# Patient Record
Sex: Female | Born: 1935 | Race: White | Smoking: Never smoker
Health system: Southern US, Community
[De-identification: ages and names within clinical notes are randomized; demographics above are authoritative.]

## PROBLEM LIST (undated history)

## (undated) DIAGNOSIS — E119 Type 2 diabetes mellitus without complications: Secondary | ICD-10-CM

## (undated) DIAGNOSIS — I10 Essential (primary) hypertension: Secondary | ICD-10-CM

## (undated) DIAGNOSIS — C801 Malignant (primary) neoplasm, unspecified: Secondary | ICD-10-CM

## (undated) DIAGNOSIS — H539 Unspecified visual disturbance: Secondary | ICD-10-CM

## (undated) HISTORY — PX: TONSILLECTOMY: SUR1361

## (undated) HISTORY — DX: Type 2 diabetes mellitus without complications: E11.9

## (undated) HISTORY — DX: Essential (primary) hypertension: I10

## (undated) HISTORY — PX: BREAST LUMPECTOMY: SHX2

## (undated) HISTORY — DX: Unspecified visual disturbance: H53.9

## (undated) HISTORY — PX: VESICOVAGINAL FISTULA CLOSURE W/ TAH: SUR271

---

## 2015-02-28 ENCOUNTER — Ambulatory Visit (INDEPENDENT_AMBULATORY_CARE_PROVIDER_SITE_OTHER): Payer: Medicare Other | Admitting: Neurology

## 2015-02-28 ENCOUNTER — Encounter: Payer: Self-pay | Admitting: Neurology

## 2015-02-28 VITALS — BP 140/88 | HR 64 | Resp 16 | Ht 64.0 in | Wt 166.6 lb

## 2015-02-28 DIAGNOSIS — M7062 Trochanteric bursitis, left hip: Secondary | ICD-10-CM | POA: Insufficient documentation

## 2015-02-28 DIAGNOSIS — R7989 Other specified abnormal findings of blood chemistry: Secondary | ICD-10-CM | POA: Diagnosis not present

## 2015-02-28 DIAGNOSIS — R269 Unspecified abnormalities of gait and mobility: Secondary | ICD-10-CM | POA: Insufficient documentation

## 2015-02-28 DIAGNOSIS — R0683 Snoring: Secondary | ICD-10-CM | POA: Diagnosis not present

## 2015-02-28 DIAGNOSIS — M7060 Trochanteric bursitis, unspecified hip: Secondary | ICD-10-CM | POA: Insufficient documentation

## 2015-02-28 DIAGNOSIS — R413 Other amnesia: Secondary | ICD-10-CM

## 2015-02-28 DIAGNOSIS — F329 Major depressive disorder, single episode, unspecified: Secondary | ICD-10-CM | POA: Diagnosis not present

## 2015-02-28 DIAGNOSIS — E538 Deficiency of other specified B group vitamins: Secondary | ICD-10-CM

## 2015-02-28 DIAGNOSIS — R4589 Other symptoms and signs involving emotional state: Secondary | ICD-10-CM

## 2015-02-28 MED ORDER — DONEPEZIL HCL 5 MG PO TABS: 5.0000 mg | ORAL_TABLET | Freq: Every day | ORAL | Status: DC
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
## 2015-03-01 ENCOUNTER — Telehealth: Payer: Self-pay | Admitting: *Deleted

## 2015-03-01 LAB — SEDIMENTATION RATE: SED RATE: 17 mm/h (ref 0–40)

## 2015-03-01 LAB — T4: T4, Total: 6 ug/dL (ref 4.5–12.0)

## 2015-03-01 LAB — TSH: TSH: 1.65 u[IU]/mL (ref 0.450–4.500)

## 2015-03-01 LAB — VITAMIN B12: Vitamin B-12: 526 pg/mL (ref 211–946)

## 2015-03-01 NOTE — Telephone Encounter (Signed)
Called and spoke to [place] (daughter-in-law) ok per DPR. Advised per Dr Felecia Shelling that lab work ok. She verbalized understanding. She stated that when her mother picked up aricept rx, the pharmacist told her the doctor should have not given her the medication. She told her about it 5 hr later. She did not know the reason because she was not with her when she got the rx from the pharmacist. She is going to call the pharmacy to find out. She said her mother-in-law gets confused easily. I advised I will give Dr Felecia Shelling the message also. Told her to call if she finds anything out. She verbalized understanding.

## 2015-03-01 NOTE — Telephone Encounter (Signed)
I have spoken with Andrea Burgess has spoken with the pharmacist--what he actually told pt. was to be sure to take Aricept at bedtime, with lots of water, to help minimize side effects./fim

## 2015-03-01 NOTE — Telephone Encounter (Signed)
-----   Message from Britt Bottom, MD sent at 03/01/2015  8:47 AM EST ----- Please let them know that the lab work looks good.

## 2015-03-12 ENCOUNTER — Encounter (HOSPITAL_BASED_OUTPATIENT_CLINIC_OR_DEPARTMENT_OTHER): Payer: Self-pay | Admitting: Emergency Medicine

## 2015-03-12 ENCOUNTER — Emergency Department (HOSPITAL_BASED_OUTPATIENT_CLINIC_OR_DEPARTMENT_OTHER): Payer: Medicare Other

## 2015-03-12 ENCOUNTER — Emergency Department (HOSPITAL_BASED_OUTPATIENT_CLINIC_OR_DEPARTMENT_OTHER)
Admission: EM | Admit: 2015-03-12 | Discharge: 2015-03-12 | Disposition: A | Discharge status: Home / Self Care | Payer: Medicare Other | Attending: Emergency Medicine | Admitting: Emergency Medicine

## 2015-03-12 DIAGNOSIS — J189 Pneumonia, unspecified organism: Secondary | ICD-10-CM

## 2015-03-12 DIAGNOSIS — I10 Essential (primary) hypertension: Secondary | ICD-10-CM | POA: Diagnosis not present

## 2015-03-12 DIAGNOSIS — R51 Headache: Secondary | ICD-10-CM | POA: Diagnosis not present

## 2015-03-12 DIAGNOSIS — R531 Weakness: Secondary | ICD-10-CM | POA: Diagnosis not present

## 2015-03-12 DIAGNOSIS — J159 Unspecified bacterial pneumonia: Secondary | ICD-10-CM | POA: Diagnosis not present

## 2015-03-12 DIAGNOSIS — Z79899 Other long term (current) drug therapy: Secondary | ICD-10-CM | POA: Diagnosis not present

## 2015-03-12 DIAGNOSIS — R05 Cough: Secondary | ICD-10-CM | POA: Diagnosis present

## 2015-03-12 DIAGNOSIS — Z8669 Personal history of other diseases of the nervous system and sense organs: Secondary | ICD-10-CM | POA: Diagnosis not present

## 2015-03-12 DIAGNOSIS — E119 Type 2 diabetes mellitus without complications: Secondary | ICD-10-CM | POA: Diagnosis not present

## 2015-03-12 LAB — COMPREHENSIVE METABOLIC PANEL
ALK PHOS: 91 U/L (ref 38–126)
ALT: 26 U/L (ref 14–54)
AST: 32 U/L (ref 15–41)
Albumin: 3 g/dL — ABNORMAL LOW (ref 3.5–5.0)
Anion gap: 7 (ref 5–15)
BILIRUBIN TOTAL: 0.7 mg/dL (ref 0.3–1.2)
BUN: 16 mg/dL (ref 6–20)
CALCIUM: 8.5 mg/dL — AB (ref 8.9–10.3)
CO2: 28 mmol/L (ref 22–32)
CREATININE: 0.64 mg/dL (ref 0.44–1.00)
Chloride: 96 mmol/L — ABNORMAL LOW (ref 101–111)
GFR calc non Af Amer: >60 mL/min (ref >60)
Glucose, Bld: 112 mg/dL — ABNORMAL HIGH (ref 65–99)
Potassium: 3.2 mmol/L — ABNORMAL LOW (ref 3.5–5.1)
SODIUM: 131 mmol/L — AB (ref 135–145)
TOTAL PROTEIN: 7.5 g/dL (ref 6.5–8.1)

## 2015-03-12 LAB — CBC WITH DIFFERENTIAL/PLATELET
BASOS PCT: 0 %
Basophils Absolute: 0 10*3/uL (ref 0.0–0.1)
EOS ABS: 0 10*3/uL (ref 0.0–0.7)
Eosinophils Relative: 0 %
HCT: 32.9 % — ABNORMAL LOW (ref 36.0–46.0)
HEMOGLOBIN: 11.2 g/dL — AB (ref 12.0–15.0)
Lymphocytes Relative: 14 %
Lymphs Abs: 1.3 10*3/uL (ref 0.7–4.0)
MCH: 34 pg (ref 26.0–34.0)
MCHC: 34 g/dL (ref 30.0–36.0)
MCV: 100 fL (ref 78.0–100.0)
MONO ABS: 0.3 10*3/uL (ref 0.1–1.0)
MONOS PCT: 3 %
NEUTROS PCT: 83 %
Neutro Abs: 7.8 10*3/uL — ABNORMAL HIGH (ref 1.7–7.7)
Platelets: 239 10*3/uL (ref 150–400)
RBC: 3.29 MIL/uL — ABNORMAL LOW (ref 3.87–5.11)
RDW: 12.4 % (ref 11.5–15.5)
WBC: 9.4 10*3/uL (ref 4.0–10.5)

## 2015-03-12 MED ORDER — SODIUM CHLORIDE 0.9 % IV BOLUS (SEPSIS)
500.0000 mL | Freq: Once | INTRAVENOUS | Status: AC
  Administered 2015-03-12: 500 mL via INTRAVENOUS

## 2015-03-12 MED ORDER — LEVOFLOXACIN 500 MG PO TABS
500.0000 mg | ORAL_TABLET | Freq: Once | ORAL | Status: AC
  Administered 2015-03-12: 500 mg via ORAL
  Filled 2015-03-12: qty 1

## 2015-03-12 MED ORDER — POTASSIUM CHLORIDE CRYS ER 20 MEQ PO TBCR
20.0000 meq | EXTENDED_RELEASE_TABLET | Freq: Once | ORAL | Status: AC
  Administered 2015-03-12: 20 meq via ORAL
  Filled 2015-03-12: qty 1

## 2015-03-12 MED ORDER — LEVOFLOXACIN 500 MG PO TABS: 500.0000 mg | ORAL_TABLET | Freq: Every day | ORAL | Status: DC

## 2015-03-12 NOTE — ED Notes (Addendum)
Pt's family reports pt passed out and fell at 28 on Friday morning in her room- states they heard her hit the floor- pt was waking up as they arrived in her room- pt reported she didn't know how she had gotten out of bed- since then she has complained of increased weakness and not ambulating per her norm

## 2015-03-12 NOTE — Discharge Instructions (Signed)
Your chest xray shows pneumonia.  Your lab work shows that you are mildly dehydrated with a slightly low sodium and potassium. It is important to drink plenty of fluids and rest. Please get rechecked immediately if you have any worsening in your breathing, confusion, new or worrisome symptoms. Please follow-up with your primary care doctor to get a recheck and have her sodium rechecked.   Community-Acquired Pneumonia, Adult Pneumonia is an infection of the lungs. There are different types of pneumonia. One type can develop while a person is in a hospital. A different type, called community-acquired pneumonia, develops in people who are not, or have not recently been, in the hospital or other health care facility.  CAUSES Pneumonia may be caused by bacteria, viruses, or funguses. Community-acquired pneumonia is often caused by Streptococcus pneumonia bacteria. These bacteria are often passed from one person to another by breathing in droplets from the cough or sneeze of an infected person. RISK FACTORS The condition is more likely to develop in:  People who havechronic diseases, such as chronic obstructive pulmonary disease (COPD), asthma, congestive heart failure, cystic fibrosis, diabetes, or kidney disease.  People who haveearly-stage or late-stage HIV.  People who havesickle cell disease.  People who havehad their spleen removed (splenectomy).  People who havepoor Human resources officer.  People who havemedical conditions that increase the risk of breathing in (aspirating) secretions their own mouth and nose.   People who havea weakened immune system (immunocompromised).  People who smoke.  People whotravel to areas where pneumonia-causing germs commonly exist.  People whoare around animal habitats or animals that have pneumonia-causing germs, including birds, bats, rabbits, cats, and farm animals. SYMPTOMS Symptoms of this condition include:  Adry cough.  A wet (productive)  cough.  Fever.  Sweating.  Chest pain, especially when breathing deeply or coughing.  Rapid breathing or difficulty breathing.  Shortness of breath.  Shaking chills.  Fatigue.  Muscle aches. DIAGNOSIS Your health care provider will take a medical history and perform a physical exam. You may also have other tests, including:  Imaging studies of your chest, including X-rays.  Tests to check your blood oxygen level and other blood gases.  Other tests on blood, mucus (sputum), fluid around your lungs (pleural fluid), and urine. If your pneumonia is severe, other tests may be done to identify the specific cause of your illness. TREATMENT The type of treatment that you receive depends on many factors, such as the cause of your pneumonia, the medicines you take, and other medical conditions that you have. For most adults, treatment and recovery from pneumonia may occur at home. In some cases, treatment must happen in a hospital. Treatment may include:  Antibiotic medicines, if the pneumonia was caused by bacteria.  Antiviral medicines, if the pneumonia was caused by a virus.  Medicines that are given by mouth or through an IV tube.  Oxygen.  Respiratory therapy. Although rare, treating severe pneumonia may include:  Mechanical ventilation. This is done if you are not breathing well on your own and you cannot maintain a safe blood oxygen level.  Thoracentesis. This procedureremoves fluid around one lung or both lungs to help you breathe better. HOME CARE INSTRUCTIONS  Take over-the-counter and prescription medicines only as told by your health care provider.  Only takecough medicine if you are losing sleep. Understand that cough medicine can prevent your body's natural ability to remove mucus from your lungs.  If you were prescribed an antibiotic medicine, take it as told by your health  care provider. Do not stop taking the antibiotic even if you start to feel  better.  Sleep in a semi-upright position at night. Try sleeping in a reclining chair, or place a few pillows under your head.  Do not use tobacco products, including cigarettes, chewing tobacco, and e-cigarettes. If you need help quitting, ask your health care provider.  Drink enough water to keep your urine clear or pale yellow. This will help to thin out mucus secretions in your lungs. PREVENTION There are ways that you can decrease your risk of developing community-acquired pneumonia. Consider getting a pneumococcal vaccine if:  You are older than 79 years of age.  You are older than 79 years of age and are undergoing cancer treatment, have chronic lung disease, or have other medical conditions that affect your immune system. Ask your health care provider if this applies to you. There are different types and schedules of pneumococcal vaccines. Ask your health care provider which vaccination option is best for you. You may also prevent community-acquired pneumonia if you take these actions:  Get an influenza vaccine every year. Ask your health care provider which type of influenza vaccine is best for you.  Go to the dentist on a regular basis.  Wash your hands often. Use hand sanitizer if soap and water are not available. SEEK MEDICAL CARE IF:  You have a fever.  You are losing sleep because you cannot control your cough with cough medicine. SEEK IMMEDIATE MEDICAL CARE IF:  You have worsening shortness of breath.  You have increased chest pain.  Your sickness becomes worse, especially if you are an older adult or have a weakened immune system.  You cough up blood.   This information is not intended to replace advice given to you by your health care provider. Make sure you discuss any questions you have with your health care provider.   Document Released: 03/04/2005 Document Revised: 11/23/2014 Document Reviewed: 06/29/2014 Elsevier Interactive Patient Education [employer]

## 2015-03-12 NOTE — ED Notes (Signed)
MD at bedside. 

## 2015-03-12 NOTE — ED Provider Notes (Signed)
CSN: [id number]     Arrival date & time 03/12/15  1432 History  By signing my name below, I, Randa Evens, attest that this documentation has been prepared under the direction and in the presence of No att. providers found. Electronically Signed: Randa Evens, ED Scribe. 03/13/2015. 12:51 AM.      Chief Complaint  Patient presents with  . Cough    Patient is a 81 y.o. female presenting with cough. The history is provided by the patient and a relative. No language interpreter was used.  Cough Associated symptoms: headaches   Associated symptoms: no chest pain (brought on from coughing) and no fever    HPI Comments: Constantina Cornatzer is a 81 y.o. female who presents to the Emergency Department complaining of productive cough onset 5 days prior. Pt reports associated congestion and HA.  Pt states that she initially thought she was getting and URI. Family states that she had worsening weakness for the 3 days. Reports fall 2 days prior with no injuries. PTdenies fever, abdominal pain n/v/d. Denies HX of seizure. Pt denies any recent hospital admissions. Family member states that she has recently been diagnosed with early stages if Dementia. Symptoms are moderate, constant, worsening.     Past Medical History  Diagnosis Date  . Diabetes mellitus without complication ([place])   . Hypertension   . Vision abnormalities    Past Surgical History  Procedure Laterality Date  . Tonsillectomy    . Vesicovaginal fistula closure w/ tah    . Breast lumpectomy Left    Family History  Problem Relation Age of Onset  . Healthy Mother    Social History  Substance Use Topics  . Smoking status: Never Smoker   . Smokeless tobacco: None  . Alcohol Use: No   OB History    No data available      Review of Systems  Constitutional: Negative for fever.  HENT: Positive for congestion.   Respiratory: Positive for cough.   Cardiovascular: Negative for chest pain (brought on from coughing).   Gastrointestinal: Negative for nausea, vomiting, abdominal pain and diarrhea.  Neurological: Positive for weakness and headaches.  All other systems reviewed and are negative.    Allergies  Review of patient's allergies indicates no known allergies.  Home Medications   Prior to Admission medications   Medication Sig Start Date End Date Taking? Authorizing Provider  donepezil (ARICEPT) 5 MG tablet Take 1 tablet (5 mg total) by mouth at bedtime. 02/28/15   Britt Bottom, MD  gabapentin (NEURONTIN) 300 MG capsule Take 300 mg by mouth 4 (four) times daily.    Historical Provider, MD  levofloxacin (LEVAQUIN) 500 MG tablet Take 1 tablet (500 mg total) by mouth daily. 03/12/15   Quintella Reichert, MD  metoprolol succinate (TOPROL-XL) 100 MG 24 hr tablet Take 100 mg by mouth daily. Take with or immediately following a meal.    Historical Provider, MD  omeprazole (PRILOSEC) 20 MG capsule Take 20 mg by mouth daily.    Historical Provider, MD  pravastatin (PRAVACHOL) 40 MG tablet Take 40 mg by mouth daily.    Historical Provider, MD  terbinafine (LAMISIL) 250 MG tablet Take 250 mg by mouth daily.    Historical Provider, MD   BP 141/79 mmHg  Pulse 94  Temp(Src) 98.9 F (37.2 C) (Oral)  Resp 20  Ht 5\' 4"  (1.626 m)  Wt 168 lb (76.204 kg)  BMI 28.82 kg/m2  SpO2 95%   Physical Exam  Constitutional: She appears  well-developed and well-nourished. No distress.  HENT:  Head: Normocephalic and atraumatic.  Eyes: Conjunctivae and EOM are normal.  Neck: Neck supple. No tracheal deviation present.  Cardiovascular: Normal rate and regular rhythm.   No murmur heard. Pulmonary/Chest: Effort normal. No respiratory distress. She has rhonchi.  Rhonchi in bilateral bases   Abdominal: Soft. There is no tenderness. There is no rebound and no guarding.  Musculoskeletal: Normal range of motion. She exhibits no edema or tenderness.  Neurological: She is alert.  Mild generalized weakness.   Skin: Skin is  warm and dry.  Psychiatric: She has a normal mood and affect. Her behavior is normal.  Nursing note and vitals reviewed.   ED Course  Procedures (including critical care time) DIAGNOSTIC STUDIES: Oxygen Saturation is 95% on RA, normal by my interpretation.    COORDINATION OF CARE: 3:24 PM-Discussed treatment plan with pt at bedside and pt agreed to plan.     Labs Review Labs Reviewed  COMPREHENSIVE METABOLIC PANEL - Abnormal; Notable for the following:    Sodium 131 (*)    Potassium 3.2 (*)    Chloride 96 (*)    Glucose, Bld 112 (*)    Calcium 8.5 (*)    Albumin 3.0 (*)    All other components within normal limits  CBC WITH DIFFERENTIAL/PLATELET - Abnormal; Notable for the following:    RBC 3.29 (*)    Hemoglobin 11.2 (*)    HCT 32.9 (*)    Neutro Abs 7.8 (*)    All other components within normal limits    Imaging Review Dg Chest 2 View  03/12/2015  CLINICAL DATA:  Cough for 5 days. Syncope and weakness. Initial encounter. EXAM: CHEST  2 VIEW COMPARISON:  None. FINDINGS: There is right middle lobe airspace disease worrisome for pneumonia. The left lung is clear. Heart size is normal. No pneumothorax or pleural effusion. IMPRESSION: Right middle lobe airspace disease worrisome for pneumonia. Recommend followup to clearing. Electronically Signed   By: Inge Rise M.D.   On: 03/12/2015 14:52   I have personally reviewed and evaluated these images and lab results as part of my medical decision-making.   EKG Interpretation None      MDM   Final diagnoses:  CAP (community acquired pneumonia)   Patient here for evaluation of cough, generalized weakness. Chest x-ray and history is concerning for pneumonia. Patient is nontoxic appearing on examination with no evidence of respiratory distress or respiratory failure. Plan to DC home with antibiotics for community-acquired pneumonia. Discussed home care as well as very close return precautions for evidence of progressive or  worsening symptoms.  I personally performed the services described in this documentation, which was scribed in my presence. The recorded information has been reviewed and is accurate.      Quintella Reichert, MD 03/13/15 [phone]

## 2015-03-12 NOTE — ED Notes (Signed)
Patient transported to X-ray 

## 2015-03-12 NOTE — ED Notes (Signed)
Patient states that she has had a cold for the last 4 - 5 days  With congested cough. The patient reports that she passed out on Friday from the weakness and today is more and more weak and more and more congested.

## 2015-03-17 ENCOUNTER — Other Ambulatory Visit: Payer: Self-pay

## 2015-04-03 ENCOUNTER — Ambulatory Visit
Admission: RE | Admit: 2015-04-03 | Discharge: 2015-04-03 | Disposition: A | Discharge status: Home / Self Care | Payer: Medicare Other | Source: Ambulatory Visit | Attending: Neurology | Admitting: Neurology

## 2015-04-03 DIAGNOSIS — R413 Other amnesia: Secondary | ICD-10-CM

## 2015-04-03 DIAGNOSIS — R269 Unspecified abnormalities of gait and mobility: Secondary | ICD-10-CM

## 2015-04-04 ENCOUNTER — Telehealth: Payer: Self-pay | Admitting: *Deleted

## 2015-04-04 NOTE — Telephone Encounter (Signed)
LMTC./fim 

## 2015-04-04 NOTE — Telephone Encounter (Signed)
I have spoken with [name] this morning and per RAS, advised that mri brain shows a lot of atrophy and age related changes that over time will commonly cause dementia.  Nothing active.  She is taking Aricept.  RAS will discuss further at f/u appt.  Debbie verbalized understanding of same./fim

## 2015-04-04 NOTE — Telephone Encounter (Signed)
-----   Message from Britt Bottom, MD sent at 04/03/2015  8:29 PM EST ----- Please let her family know that the MRI of the brain shows a lot of atrophy and age-related changes in those factors usually develop over time and commonly cause dementia. Nothing that looks active

## 2015-04-15 DIAGNOSIS — E7800 Pure hypercholesterolemia, unspecified: Secondary | ICD-10-CM | POA: Insufficient documentation

## 2015-04-15 DIAGNOSIS — J309 Allergic rhinitis, unspecified: Secondary | ICD-10-CM | POA: Insufficient documentation

## 2015-04-15 DIAGNOSIS — E119 Type 2 diabetes mellitus without complications: Secondary | ICD-10-CM | POA: Insufficient documentation

## 2015-04-15 DIAGNOSIS — IMO0002 Reserved for concepts with insufficient information to code with codable children: Secondary | ICD-10-CM | POA: Insufficient documentation

## 2015-04-15 DIAGNOSIS — Q846 Other congenital malformations of nails: Secondary | ICD-10-CM | POA: Insufficient documentation

## 2015-04-15 DIAGNOSIS — D519 Vitamin B12 deficiency anemia, unspecified: Secondary | ICD-10-CM | POA: Insufficient documentation

## 2015-04-15 DIAGNOSIS — R413 Other amnesia: Secondary | ICD-10-CM | POA: Insufficient documentation

## 2015-04-15 DIAGNOSIS — F5102 Adjustment insomnia: Secondary | ICD-10-CM | POA: Insufficient documentation

## 2015-04-15 DIAGNOSIS — M26629 Arthralgia of temporomandibular joint, unspecified side: Secondary | ICD-10-CM | POA: Insufficient documentation

## 2015-04-15 DIAGNOSIS — I10 Essential (primary) hypertension: Secondary | ICD-10-CM | POA: Insufficient documentation

## 2015-05-08 DIAGNOSIS — M47817 Spondylosis without myelopathy or radiculopathy, lumbosacral region: Secondary | ICD-10-CM | POA: Insufficient documentation

## 2015-05-13 DIAGNOSIS — M545 Low back pain: Secondary | ICD-10-CM

## 2015-05-13 DIAGNOSIS — G8929 Other chronic pain: Secondary | ICD-10-CM | POA: Insufficient documentation

## 2015-05-13 DIAGNOSIS — K219 Gastro-esophageal reflux disease without esophagitis: Secondary | ICD-10-CM | POA: Insufficient documentation

## 2015-06-01 ENCOUNTER — Ambulatory Visit (INDEPENDENT_AMBULATORY_CARE_PROVIDER_SITE_OTHER): Payer: Medicare Other | Admitting: Neurology

## 2015-06-01 ENCOUNTER — Encounter: Payer: Self-pay | Admitting: Neurology

## 2015-06-01 VITALS — BP 148/96 | HR 68 | Resp 18 | Ht 64.0 in | Wt 159.4 lb

## 2015-06-01 DIAGNOSIS — F329 Major depressive disorder, single episode, unspecified: Secondary | ICD-10-CM | POA: Diagnosis not present

## 2015-06-01 DIAGNOSIS — R269 Unspecified abnormalities of gait and mobility: Secondary | ICD-10-CM | POA: Diagnosis not present

## 2015-06-01 DIAGNOSIS — F5102 Adjustment insomnia: Secondary | ICD-10-CM

## 2015-06-01 DIAGNOSIS — F0280 Dementia in other diseases classified elsewhere without behavioral disturbance: Secondary | ICD-10-CM | POA: Insufficient documentation

## 2015-06-01 DIAGNOSIS — R4589 Other symptoms and signs involving emotional state: Secondary | ICD-10-CM

## 2015-06-01 DIAGNOSIS — G301 Alzheimer's disease with late onset: Secondary | ICD-10-CM

## 2015-06-01 DIAGNOSIS — M5442 Lumbago with sciatica, left side: Secondary | ICD-10-CM

## 2015-06-01 DIAGNOSIS — M7552 Bursitis of left shoulder: Secondary | ICD-10-CM | POA: Diagnosis not present

## 2015-06-01 DIAGNOSIS — Z92241 Personal history of systemic steroid therapy: Secondary | ICD-10-CM | POA: Diagnosis not present

## 2015-06-01 DIAGNOSIS — M7550 Bursitis of unspecified shoulder: Secondary | ICD-10-CM | POA: Insufficient documentation

## 2015-06-01 DIAGNOSIS — M7062 Trochanteric bursitis, left hip: Secondary | ICD-10-CM

## 2015-06-01 DIAGNOSIS — G309 Alzheimer's disease, unspecified: Secondary | ICD-10-CM

## 2015-06-01 MED ORDER — MEMANTINE HCL 10 MG PO TABS: 10.0000 mg | ORAL_TABLET | Freq: Two times a day (BID) | ORAL | Status: DC

## 2015-06-01 MED ORDER — MEMANTINE HCL 28 X 5 MG & 21 X 10 MG PO TABS: ORAL_TABLET | ORAL | Status: DC
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
## 2015-06-07 ENCOUNTER — Telehealth: Payer: Self-pay | Admitting: Neurology

## 2015-06-07 NOTE — Telephone Encounter (Signed)
Andrea Burgess called to advise, [facility] needs PA for memantine [facility] TITRATION PAK) tablet pack, memantine (NAMENDA) 10 MG tablet.

## 2015-06-07 NOTE — Telephone Encounter (Signed)
Debbie also wanted to advise that when patient was here for recent visit, she kept insisting that her highest level of education was college. Debbie states, patient took a Scientist, research (life sciences) estate course, however she didn't attend college.

## 2015-06-08 ENCOUNTER — Telehealth: Payer: Self-pay | Admitting: Neurology

## 2015-06-08 NOTE — Telephone Encounter (Signed)
Pt's daughter [name] called sts the injection in lumbar helped for 3-4 days but it is hurting again. Daughter said she seems to be moving more freely but still having problems getting up and down. What is the next step.

## 2015-06-09 NOTE — Telephone Encounter (Signed)
I have spoken with [name] and advised that I have spoken with Alroy Dust at [facility] who sts. Namenda does not need a PA.  Ref # [id number].  I attempted to contact [facility] with this information but was placed on hold 3 times, finally had to end the call/fim

## 2015-06-09 NOTE — Telephone Encounter (Signed)
I have spoken with Andrea Burgess and advised that [facility] would like to get results of bone density exam that was ordered at last ov.  Quynn should continue Diclofenac that she is currently taking, and continue reasonable activity/exercise.  Debbie verbalized understanding of same/fim

## 2015-06-15 ENCOUNTER — Telehealth: Payer: Self-pay | Admitting: Neurology

## 2015-06-15 ENCOUNTER — Other Ambulatory Visit: Payer: Self-pay | Admitting: *Deleted

## 2015-06-15 DIAGNOSIS — Z7952 Long term (current) use of systemic steroids: Secondary | ICD-10-CM

## 2015-06-15 NOTE — Telephone Encounter (Signed)
Called and spoke to patient's daughter [name] [phone] . Patient is scheduled for Bone Density test at [address] 07/04/2015 at 11:15 am . Per [facility] she needs to hold calcium for two days. PCP has her on calcium she will call and check with Patient's PCP is ok with this order. Patient daughter under stood all details of her mother's apt.

## 2015-06-15 NOTE — Telephone Encounter (Signed)
Hey Faith per [facility] order needs to be change with this code Z79.52. Long steroid use  Or insurance want pay. DG Bone Density Scan. Patient's daughter want's me to call her when scheduled. Debbie [phone]. Please send back to me I will call [facility] and have patient scheduled. Thanks Hinton Dyer.

## 2015-07-03 ENCOUNTER — Telehealth: Payer: Self-pay | Admitting: Neurology

## 2015-07-03 NOTE — Telephone Encounter (Signed)
Form signed and faxed back to [facility] (for bone density scan)/fim

## 2015-07-03 NOTE — Telephone Encounter (Signed)
Felia with the [facility] is calling and states she receivied a referral for the patient and needs Dr. Felecia Shelling to either sign the order in EPIC or on the order form she faxed. Her phone number is [phone].

## 2015-07-04 ENCOUNTER — Ambulatory Visit
Admission: RE | Admit: 2015-07-04 | Discharge: 2015-07-04 | Disposition: A | Discharge status: Home / Self Care | Payer: Medicare Other | Source: Ambulatory Visit | Attending: Neurology | Admitting: Neurology

## 2015-07-04 DIAGNOSIS — Z7952 Long term (current) use of systemic steroids: Secondary | ICD-10-CM

## 2015-07-06 ENCOUNTER — Telehealth: Payer: Self-pay | Admitting: *Deleted

## 2015-07-06 NOTE — Telephone Encounter (Signed)
I have spoken with Debbie (dtr. in law) and per RAS, advised that bone density scan showed osteopenia--for now she should take otc calcium an vit. d 2,000iu daily.  She verbalized understanding of same/fim

## 2015-07-06 NOTE — Telephone Encounter (Signed)
-----   Message from Britt Bottom, MD sent at 07/05/2015  5:27 PM EDT ----- Please let them know that the bone density study does show osteopenia. For now, she should take over the counter calcium and 2000 units daily of vitamin D. We can also forward the results to her primary care physician as she may need further treatment if this worsens in the future

## 2015-07-11 ENCOUNTER — Telehealth: Payer: Self-pay | Admitting: Neurology

## 2015-07-11 NOTE — Telephone Encounter (Signed)
I have spoken with pharmacist at [facility], pa is not needed.  I have advised they request genric (rx. is not brand name med. nec,)/fim

## 2015-07-11 NOTE — Telephone Encounter (Signed)
Margaret/[facility] [phone] called to request PA for memantine [facility] TITRATION PAK) tablet pack, states this medication does require PA.

## 2015-09-12 DIAGNOSIS — R319 Hematuria, unspecified: Secondary | ICD-10-CM | POA: Insufficient documentation

## 2015-10-02 ENCOUNTER — Ambulatory Visit (INDEPENDENT_AMBULATORY_CARE_PROVIDER_SITE_OTHER): Payer: Medicare Other | Admitting: Neurology

## 2015-10-02 ENCOUNTER — Encounter: Payer: Self-pay | Admitting: Neurology

## 2015-10-02 VITALS — BP 114/72 | HR 70 | Resp 18 | Ht 64.0 in | Wt 156.0 lb

## 2015-10-02 DIAGNOSIS — F0280 Dementia in other diseases classified elsewhere without behavioral disturbance: Secondary | ICD-10-CM | POA: Diagnosis not present

## 2015-10-02 DIAGNOSIS — M7062 Trochanteric bursitis, left hip: Secondary | ICD-10-CM | POA: Diagnosis not present

## 2015-10-02 DIAGNOSIS — M7552 Bursitis of left shoulder: Secondary | ICD-10-CM

## 2015-10-02 DIAGNOSIS — R4589 Other symptoms and signs involving emotional state: Secondary | ICD-10-CM

## 2015-10-02 DIAGNOSIS — G301 Alzheimer's disease with late onset: Secondary | ICD-10-CM

## 2015-10-02 DIAGNOSIS — F329 Major depressive disorder, single episode, unspecified: Secondary | ICD-10-CM

## 2015-10-02 DIAGNOSIS — R269 Unspecified abnormalities of gait and mobility: Secondary | ICD-10-CM

## 2015-10-02 MED ORDER — DONEPEZIL HCL 10 MG PO TABS: 5.0000 mg | ORAL_TABLET | Freq: Every day | ORAL | Status: DC
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
## 2015-10-04 ENCOUNTER — Other Ambulatory Visit: Payer: Self-pay | Admitting: Pain Medicine

## 2015-10-04 DIAGNOSIS — M545 Low back pain: Secondary | ICD-10-CM

## 2015-10-13 ENCOUNTER — Ambulatory Visit
Admission: RE | Admit: 2015-10-13 | Discharge: 2015-10-13 | Disposition: A | Discharge status: Home / Self Care | Payer: Medicare Other | Source: Ambulatory Visit | Attending: Pain Medicine | Admitting: Pain Medicine

## 2015-10-13 DIAGNOSIS — M545 Low back pain: Secondary | ICD-10-CM

## 2015-11-13 ENCOUNTER — Telehealth: Payer: Self-pay | Admitting: Neurology

## 2015-11-13 MED ORDER — DONEPEZIL HCL 10 MG PO TABS: 10.0000 mg | ORAL_TABLET | Freq: Every day | ORAL | 11 refills | Status: AC

## 2015-11-13 NOTE — Telephone Encounter (Signed)
I have spoken with Debbie.  Per RAS last ov, she is correct in giving [place] Aricept 10mg  po daily.  New rx. escribed to pharmacy so instructions will be correct.  She verbalized understanding of same/fim

## 2015-11-13 NOTE — Telephone Encounter (Signed)
Pt's daughter-in-law called to advise when she picked up the refill for donepezil (ARICEPT) 10 MG tablet she noticed it was 10mg  and the directions were to give 1/2 tablet but she said has been giving the pt 1 tablet daily because she thought it was still 5mg . She said the pt is better with the 10mg , she is not talking about imaginary people. Please call to clarify.

## 2016-04-08 ENCOUNTER — Ambulatory Visit (INDEPENDENT_AMBULATORY_CARE_PROVIDER_SITE_OTHER): Payer: Medicare Other | Admitting: Neurology

## 2016-04-08 ENCOUNTER — Encounter: Payer: Self-pay | Admitting: Neurology

## 2016-04-08 VITALS — BP 138/90 | HR 68 | Resp 18 | Ht 64.0 in | Wt 150.5 lb

## 2016-04-08 DIAGNOSIS — M545 Low back pain, unspecified: Secondary | ICD-10-CM

## 2016-04-08 DIAGNOSIS — G8929 Other chronic pain: Secondary | ICD-10-CM | POA: Diagnosis not present

## 2016-04-08 DIAGNOSIS — G301 Alzheimer's disease with late onset: Secondary | ICD-10-CM

## 2016-04-08 DIAGNOSIS — F0280 Dementia in other diseases classified elsewhere without behavioral disturbance: Secondary | ICD-10-CM | POA: Diagnosis not present

## 2016-04-08 DIAGNOSIS — R269 Unspecified abnormalities of gait and mobility: Secondary | ICD-10-CM | POA: Diagnosis not present

## 2016-04-08 DIAGNOSIS — M7062 Trochanteric bursitis, left hip: Secondary | ICD-10-CM | POA: Diagnosis not present

## 2016-04-08 NOTE — Patient Instructions (Signed)
Memantine 10 mg pills:  For 5 days, take one half pill once a day For the next 5 days, take one half pill twice a day Then increase to one pill twice a day

## 2016-04-08 NOTE — Progress Notes (Signed)
[facility]  PATIENT: Andrea Burgess DOB: 08/02/1927  REFERRING DOCTOR OR PCP:  Ferne Coe, PA-C ([place]). SOURCE: patient, records form PCP  _________________________________   HISTORICAL  CHIEF COMPLAINT:  Chief Complaint  Patient presents with  . Alzheimer's Disease    Sts. she feels memory is stable.  She is not taking Namenda--sts. she never received it.  Sts. she is seeing Dr. Andree Elk at [facility] for ? rf procedure/fim  . Back Pain    HISTORY OF PRESENT ILLNESS:  Andrea Burgess is an 83 yo woman with memory loss, hip pain and back pain.    Alzheimer's Disease:      Aricept was added initially and she felt memory was slightly better.   She was unable to get Namenda.  She feels long term memory is better than STM.  She has trouble using cell phone and remote control.       She has difficulties with short-term memory and also has misplaced items around the house. She stopped driving in [id number].   She is not reading books as frequently in the past but still looks at magazines to read. She has had some difficulty with writing out checks.   MRI of the brain that showed very severe frontal lobe atrophy with more modest mesial temporal and parietal lobe atrophy. She also had extensive chronic microvascular ischemic changes.     She has no FH of Alzheimer or other dementia.      Montreal Cognitive Assessment  06/01/2015 02/28/2015  Visuospatial/ Executive (0/5) 2 2  Naming (0/3) 1 1  Attention: Read list of digits (0/2) 2 2  Attention: Read list of letters (0/1) 1 1  Attention: Serial 7 subtraction starting at 100 (0/3) 0 0  Language: Repeat phrase (0/2) 2 2  Language : Fluency (0/1) 0 0  Abstraction (0/2) 2 2  Delayed Recall (0/5) 0 0  Orientation (0/6) 4 5  Total 14 15  Adjusted Score (based on education) 14 16    Sleep:   She has insomnia, sleep onset and sleep maintenance.   She has trouble getting comfortable at night    Mood:   Mood is better this year.   No anxiety.   No behavoiral issues  Leg/Hip pain:   She reports chronic left hip, left leg pain pain.  She now sees Pain management and is having procedures done.      The hip and leg pain worsens when she stands a while or she walks. Her leg can give out if she walks far enough.   She is on gabapentin for pain.   A left trochanteric bursa injection x 2 Only helped a lot for a couple weeks..   Shoulder pain:    The left shoulder pain is better with just mild tenderness now    A subacromial bursa injection helped a few months    REVIEW OF SYSTEMS: Constitutional: No fevers, chills, sweats, or change in appetite.    Eyes: No visual changes, double vision, eye pain Ear, nose and throat: No hearing loss, ear pain, nasal congestion, sore throat Cardiovascular: No chest pain, palpitations Respiratory: No shortness of breath at rest or with exertion.   No wheezes.  Snores.   GastrointestinaI: No nausea, vomiting, diarrhea, abdominal pain, fecal incontinence Genitourinary: No dysuria, urinary retention or frequency.  No nocturia. Musculoskeletal: as above Integumentary: No rash, pruritus, skin lesions Neurological: as above Psychiatric: No depression at this time.  No anxiety Endocrine: No palpitations, diaphoresis,  change in appetite, change in weigh or increased thirst Hematologic/Lymphatic: No anemia, purpura, petechiae. Allergic/Immunologic: No itchy/runny eyes, nasal congestion, recent allergic reactions, rashes  ALLERGIES: No Known Allergies  HOME MEDICATIONS:  Current Outpatient Prescriptions:  .  acetaminophen (TYLENOL) 325 MG tablet, Take 650 mg by mouth every 6 (six) hours as needed., Disp: , Rfl:  .  Calcium-Vitamins C & D (CALCIUM/C/D) 500-10-250 MG-MG-UNIT CHEW, Chew by mouth., Disp: , Rfl:  .  diclofenac (VOLTAREN) 75 MG EC tablet, , Disp: , Rfl:  .  donepezil (ARICEPT) 10 MG tablet, Take 1 tablet (10 mg total) by mouth at bedtime.,  Disp: 30 tablet, Rfl: 11 .  gabapentin (NEURONTIN) 300 MG capsule, Take 300 mg by mouth 4 (four) times daily., Disp: , Rfl:  .  hydrochlorothiazide (HYDRODIURIL) 12.5 MG tablet, Take 12.5 mg by mouth., Disp: , Rfl:  .  losartan (COZAAR) 25 MG tablet, Take 25 mg by mouth daily., Disp: , Rfl:  .  memantine (NAMENDA) 10 MG tablet, Take 1 tablet (10 mg total) by mouth 2 (two) times daily., Disp: 60 tablet, Rfl: 5 .  omeprazole (PRILOSEC) 20 MG capsule, Take 20 mg by mouth daily., Disp: , Rfl:  .  pravastatin (PRAVACHOL) 40 MG tablet, Take 40 mg by mouth daily., Disp: , Rfl:  .  memantine (NAMENDA TITRATION PAK) tablet pack, 5 mg/day for =1 week; 5 mg twice daily for =1 week; 15 mg/day given in 5 mg and 10 mg separated doses for =1 week; then 10 mg twice daily (Patient not taking: Reported on 04/08/2016), Disp: 49 tablet, Rfl: 12  PAST MEDICAL HISTORY: Past Medical History:  Diagnosis Date  . Diabetes mellitus without complication ([place])   . Hypertension   . Vision abnormalities     PAST SURGICAL HISTORY: Past Surgical History:  Procedure Laterality Date  . BREAST LUMPECTOMY Left   . TONSILLECTOMY    . VESICOVAGINAL FISTULA CLOSURE W/ TAH      FAMILY HISTORY: Family History  Problem Relation Age of Onset  . Healthy Mother     SOCIAL HISTORY:  Social History   Social History  . Marital status: Widowed    Spouse name: N/A  . Number of children: N/A  . Years of education: N/A   Occupational History  . Not on file.   Social History Main Topics  . Smoking status: Never Smoker  . Smokeless tobacco: Never Used  . Alcohol use No  . Drug use: No  . Sexual activity: Not on file   Other Topics Concern  . Not on file   Social History Narrative  . No narrative on file     PHYSICAL EXAM  Vitals:   04/08/16 1005  BP: 138/90  Pulse: 68  Resp: 18  Weight: 150 lb 8 oz (68.3 kg)  Height: 5\' 4"  (1.626 m)    Body mass index is 25.83 kg/m.   General: The patient is  well-developed and well-nourished and in no acute distress  Musculoskeletal: Tender over left subacromial bursa.   Tender over left trochanteric bursa and mildly tender right subacromial burs  Neurologic Exam  Mental status: The patient is alert and oriented x 2 1/2 (month not year) at the time of the examination. The patient has reduced recent (0/3 and 2/3 with prompt) and better remote memory, with poor attention span ( WORLD-DLORW) and concentration ability.   Speech is normal.     Cranial nerves: Extraocular movements are full.   Facial symmetry is present. There is  good facial sensation to soft touch bilaterally.Facial strength is normal.  Trapezius and sternocleidomastoid strength is normal. No dysarthria is noted.  The tongue is midline, and the patient has symmetric elevation of the soft palate. Mild/moderate reduced hearing noted.  Motor:  Muscle bulk is normal.   Tone is normal. Strength is  5 / 5 in all 4 extremities.   Sensory: Sensory testing is intact to pinprick, soft touch and vibration sensation in all 4 extremities.  Coordination: Cerebellar testing reveals good finger-nose-finger and heel-to-shin bilaterally.  Gait and station: Station is normal.   Gait is arthritic with a short stride and she cannot tandem, (back hurting more today)  Reflexes: Deep tendon reflexes are symmetric and normal bilaterally.   Plantar responses are flexor.    DIAGNOSTIC DATA (LABS, IMAGING, TESTING) - I reviewed patient records, labs, notes, testing and imaging myself where available.      ASSESSMENT AND PLAN  Late onset Alzheimer's disease without behavioral disturbance  Gait disturbance  Chronic midline low back pain without sciatica  Trochanteric bursitis of left hip   1.    We will try to Add Namenda, titrate to 10 mg po bid. GoodRx coupon provided if insurance will not cover for her FDA indicated moderate to severe AD.    Continue donepezil to 10 mg.    2.    She will follow  up with Pain management for injection/RF.    Considr repeat subac bursa inj if shoulder pain returns.  3.   Continue vit D 4.   rtc 6 months, sooner if problems  She will return to see me in 6 months or sooner if there are new or worsening neurologic symptoms.   Demetrio Leighty A. Felecia Shelling, MD, PhD [id number], [id number] AM Certified in Neurology, Clinical Neurophysiology, Sleep Medicine, Pain Medicine and Neuroimaging  [facility] [address], [place], [place] [address] [phone] `

## 2016-04-12 ENCOUNTER — Encounter: Payer: Self-pay | Admitting: Neurology

## 2016-04-12 MED ORDER — MEMANTINE HCL 28 X 5 MG & 21 X 10 MG PO TABS: ORAL_TABLET | ORAL | 0 refills | Status: DC

## 2016-04-12 MED ORDER — MEMANTINE HCL 28 X 5 MG & 21 X 10 MG PO TABS: ORAL_TABLET | ORAL | 12 refills | Status: DC

## 2016-04-23 ENCOUNTER — Encounter: Payer: Self-pay | Admitting: Neurology

## 2016-05-07 ENCOUNTER — Encounter: Payer: Self-pay | Admitting: Neurology

## 2016-05-14 ENCOUNTER — Encounter (HOSPITAL_COMMUNITY): Payer: Self-pay | Admitting: Emergency Medicine

## 2016-05-14 ENCOUNTER — Emergency Department (HOSPITAL_COMMUNITY)
Admission: EM | Admit: 2016-05-14 | Discharge: 2016-05-14 | Disposition: A | Discharge status: Home / Self Care | Payer: Medicare Other | Attending: Emergency Medicine | Admitting: Emergency Medicine

## 2016-05-14 ENCOUNTER — Emergency Department (HOSPITAL_COMMUNITY): Payer: Medicare Other

## 2016-05-14 DIAGNOSIS — R531 Weakness: Secondary | ICD-10-CM | POA: Diagnosis not present

## 2016-05-14 DIAGNOSIS — Z79899 Other long term (current) drug therapy: Secondary | ICD-10-CM | POA: Insufficient documentation

## 2016-05-14 DIAGNOSIS — M8580 Other specified disorders of bone density and structure, unspecified site: Secondary | ICD-10-CM | POA: Insufficient documentation

## 2016-05-14 DIAGNOSIS — R079 Chest pain, unspecified: Secondary | ICD-10-CM | POA: Diagnosis not present

## 2016-05-14 DIAGNOSIS — I10 Essential (primary) hypertension: Secondary | ICD-10-CM | POA: Diagnosis not present

## 2016-05-14 DIAGNOSIS — G309 Alzheimer's disease, unspecified: Secondary | ICD-10-CM | POA: Insufficient documentation

## 2016-05-14 DIAGNOSIS — E119 Type 2 diabetes mellitus without complications: Secondary | ICD-10-CM | POA: Insufficient documentation

## 2016-05-14 LAB — URINALYSIS, ROUTINE W REFLEX MICROSCOPIC
BACTERIA UA: NONE SEEN
Bilirubin Urine: NEGATIVE
Glucose, UA: NEGATIVE mg/dL
Ketones, ur: NEGATIVE mg/dL
Leukocytes, UA: NEGATIVE
Nitrite: NEGATIVE
PH: 8 (ref 5.0–8.0)
Protein, ur: NEGATIVE mg/dL
SPECIFIC GRAVITY, URINE: 1.009 (ref 1.005–1.030)

## 2016-05-14 LAB — CBC WITH DIFFERENTIAL/PLATELET
BASOS ABS: 0 10*3/uL (ref 0.0–0.1)
Basophils Relative: 0 %
EOS PCT: 1 %
Eosinophils Absolute: 0.1 10*3/uL (ref 0.0–0.7)
HCT: 29.9 % — ABNORMAL LOW (ref 36.0–46.0)
Hemoglobin: 9.9 g/dL — ABNORMAL LOW (ref 12.0–15.0)
LYMPHS ABS: 2.3 10*3/uL (ref 0.7–4.0)
LYMPHS PCT: 53 %
MCH: 33.6 pg (ref 26.0–34.0)
MCHC: 33.1 g/dL (ref 30.0–36.0)
MCV: 101.4 fL — AB (ref 78.0–100.0)
MONO ABS: 0.4 10*3/uL (ref 0.1–1.0)
Monocytes Relative: 8 %
Neutro Abs: 1.7 10*3/uL (ref 1.7–7.7)
Neutrophils Relative %: 38 %
PLATELETS: 279 10*3/uL (ref 150–400)
RBC: 2.95 MIL/uL — ABNORMAL LOW (ref 3.87–5.11)
RDW: 14 % (ref 11.5–15.5)
WBC: 4.4 10*3/uL (ref 4.0–10.5)

## 2016-05-14 LAB — COMPREHENSIVE METABOLIC PANEL
ALBUMIN: 2.8 g/dL — AB (ref 3.5–5.0)
ALT: 12 U/L — AB (ref 14–54)
AST: 20 U/L (ref 15–41)
Alkaline Phosphatase: 72 U/L (ref 38–126)
Anion gap: 8 (ref 5–15)
BILIRUBIN TOTAL: 0.5 mg/dL (ref 0.3–1.2)
BUN: 10 mg/dL (ref 6–20)
CO2: 26 mmol/L (ref 22–32)
Calcium: 9.6 mg/dL (ref 8.9–10.3)
Chloride: 103 mmol/L (ref 101–111)
Creatinine, Ser: 0.61 mg/dL (ref 0.44–1.00)
GFR calc Af Amer: >60 mL/min (ref >60)
GFR calc non Af Amer: >60 mL/min (ref >60)
GLUCOSE: 100 mg/dL — AB (ref 65–99)
POTASSIUM: 3.1 mmol/L — AB (ref 3.5–5.1)
Sodium: 137 mmol/L (ref 135–145)
TOTAL PROTEIN: 9 g/dL — AB (ref 6.5–8.1)

## 2016-05-14 LAB — TROPONIN I: Troponin I: <0.03 ng/mL

## 2016-05-14 MED ORDER — IOPAMIDOL (ISOVUE-300) INJECTION 61%
INTRAVENOUS | Status: AC
  Administered 2016-05-14: 75 mL
  Filled 2016-05-14: qty 75

## 2016-05-14 NOTE — ED Triage Notes (Signed)
Per EMS, pt has not been feeling good the past week. Pt reports that she "doesn't feel like herself." Pt's BP from EMS 179/88 NSR. Pt has taken her BP medications today. Pt reports back pain that she gets shots for and that the shots this time did not help like they usually do.

## 2016-05-14 NOTE — ED Notes (Signed)
Delay in lab draw,  Pt not in room at this time. 

## 2016-05-14 NOTE — Discharge Planning (Signed)
Pt up for discharge. EDCM reviewed chart for possible CM needs.  No needs identified or communicated.  

## 2016-05-14 NOTE — ED Provider Notes (Signed)
[facility] DEPT Provider Note   CSN: [id number] Arrival date & time: 05/14/16  0005   By signing my name below, I, Delton Prairie, attest that this documentation has been prepared under the direction and in the presence of Varney Biles, MD  Electronically Signed: Delton Prairie, ED Scribe. 05/14/16. 12:53 AM.   History   Chief Complaint Chief Complaint  Patient presents with  . Hypertension   The history is provided by the patient and a relative. No language interpreter was used.   HPI Comments:  Andrea Burgess is a 83 y.o. female, with a hx HTN on losartan and DM, of who presents to the Emergency Department complaining of weakness onset 2 weeks. Pt also reports chills, frequency, lower abdominal pain and left hip pain onset several weeks. Family reports the pt has been reporting an intermittent right sided pain onset several days, a decrease in appetite and bilateral leg swelling. Family also reports she has had to help the pt to get to the bathroom in the past few days and new medication use, Namenda, within the past 30 days. Daughter-in-law states she checked the pt's blood pressure prior to EMS arrival and reports it has been elevated. No alleviating factors noted. Pt denies fevers, chills, nausea, vomiting, generalized body aches, nasal congestion, a cough, dysuria, new back pain, hematuria, skin rash, chest pain, numbness/tingling or any other associated symptoms.     Past Medical History:  Diagnosis Date  . Diabetes mellitus without complication ([place])   . Hypertension   . Vision abnormalities     Patient Active Problem List   Diagnosis Date Noted  . Blood in the urine 09/12/2015  . Alzheimer's disease 06/01/2015  . Midline low back pain with left-sided sciatica 06/01/2015  . Subacromial bursitis 06/01/2015  . History of recent steroid use 06/01/2015  . Chronic low back pain 05/13/2015  . Gastro-esophageal reflux disease without esophagitis 05/13/2015  . Lumbosacral  spondylosis 05/08/2015  . Abnormality of shape of nail 04/15/2015  . Adjustment insomnia 04/15/2015  . Allergic rhinitis 04/15/2015  . Type 2 diabetes mellitus ([place]) 04/15/2015  . Essential (primary) hypertension 04/15/2015  . Bladder cystocele 04/15/2015  . Hypercholesterolemia 04/15/2015  . Bad memory 04/15/2015  . 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 Vitamin B12 deficiency anemia 04/15/2015  . Memory loss 02/28/2015  . Low serum vitamin B12 02/28/2015  . Trochanteric bursitis of left hip 02/28/2015  . Gait disturbance 02/28/2015  . Snoring 02/28/2015  . Depressed affect 02/28/2015  . Abnormal gait 02/28/2015  . Bursitis, trochanteric 02/28/2015    Past Surgical History:  Procedure Laterality Date  . BREAST LUMPECTOMY Left   . TONSILLECTOMY    . VESICOVAGINAL FISTULA CLOSURE W/ TAH      OB History    No data available       Home Medications    Prior to Admission medications   Medication Sig Start Date End Date Taking? Authorizing Provider  baclofen (LIORESAL) 10 MG tablet Take 10 mg by mouth 2 (two) times daily.   Yes Historical Provider, MD  CALCIUM PO Take 1 tablet by mouth 2 (two) times daily.   Yes Historical Provider, MD  Cyanocobalamin (VITAMIN B12 PO) Take 1 tablet by mouth daily.   Yes Historical Provider, MD  diclofenac (VOLTAREN) 75 MG EC tablet Take 75 mg by mouth 2 (two) times daily.  05/08/15  Yes Historical Provider, MD  donepezil (ARICEPT) 10 MG tablet Take 1 tablet (10 mg total) by mouth at bedtime. Patient taking differently:  Take 10 mg by mouth every morning.  11/13/15  Yes Britt Bottom, MD  gabapentin (NEURONTIN) 300 MG capsule Take 300 mg by mouth 4 (four) times daily.   Yes Historical Provider, MD  HYDROcodone-acetaminophen (NORCO/VICODIN) 5-325 MG tablet Take 1 tablet by mouth 4 (four) times daily as needed for moderate pain.   Yes Historical Provider, MD  ibuprofen (ADVIL,MOTRIN) 200 MG tablet Take 400 mg by mouth  every 6 (six) hours as needed for headache or moderate pain.   Yes Historical Provider, MD  loratadine (CLARITIN) 10 MG tablet Take 10 mg by mouth daily.   Yes Historical Provider, MD  losartan (COZAAR) 25 MG tablet Take 25 mg by mouth daily.   Yes Historical Provider, MD  memantine (NAMENDA) 5 MG tablet Take 5 mg by mouth 2 (two) times daily.   Yes Historical Provider, MD  omeprazole (PRILOSEC) 40 MG capsule Take 40 mg by mouth daily.    Yes Historical Provider, MD  pravastatin (PRAVACHOL) 40 MG tablet Take 40 mg by mouth daily.   Yes Historical Provider, MD  acetaminophen (TYLENOL) 325 MG tablet Take 650 mg by mouth every 6 (six) hours as needed for moderate pain or headache.     Historical Provider, MD  memantine [facility] TITRATION PAK) tablet pack 5 mg/day for =1 week; 5 mg twice daily for =1 week; 15 mg/day given in 5 mg and 10 mg separated doses for =1 week; then 10 mg twice daily Patient not taking: Reported on 05/14/2016 04/12/16   Britt Bottom, MD    Family History Family History  Problem Relation Age of Onset  . Healthy Mother     Social History Social History  Substance Use Topics  . Smoking status: Never Smoker  . Smokeless tobacco: Never Used  . Alcohol use No     Allergies   Patient has no known allergies.   Review of Systems Review of Systems 10 systems reviewed and all are negative for acute change except as noted in the HPI.  Physical Exam Updated Vital Signs BP 152/93   Pulse 85   Temp 98.7 F (37.1 C) (Oral)   Resp 18   Ht 5\' 4"  (1.626 m)   Wt 149 lb (67.6 kg)   SpO2 99%   BMI 25.58 kg/m   Physical Exam  Constitutional: She is oriented to person, place, and time. She appears well-developed and well-nourished. No distress.  HENT:  Head: Normocephalic and atraumatic.  Eyes: EOM are normal.  Neck: Normal range of motion. No JVD present.  Cardiovascular: Normal rate, regular rhythm and normal heart sounds.   Pulses:      Radial pulses are 2+ on  the right side, and 2+ on the left side.  Radial pulses 2+ and equal bilaterally.   Pulmonary/Chest: Effort normal and breath sounds normal.  Lungs clear to auscultation.   Abdominal: Soft. She exhibits no distension. There is tenderness in the right lower quadrant.  Mild tenderness over the RLQ and right flank region  Musculoskeletal: Normal range of motion. She exhibits edema.  Trace pitting edema bilaterally.   Neurological: She is alert and oriented to person, place, and time.  Skin: Skin is warm and dry.  Psychiatric: She has a normal mood and affect. Judgment normal.  Nursing note and vitals reviewed.  ED Treatments / Results  DIAGNOSTIC STUDIES:  Oxygen Saturation is 97% on RA, normal by my interpretation.    COORDINATION OF CARE:  12:36 AM Discussed treatment plan with pt  at bedside and pt agreed to plan.  Labs (all labs ordered are listed, but only abnormal results are displayed) Labs Reviewed  COMPREHENSIVE METABOLIC PANEL - Abnormal; Notable for the following:       Result Value   Potassium 3.1 (*)    Glucose, Bld 100 (*)    Total Protein 9.0 (*)    Albumin 2.8 (*)    ALT 12 (*)    All other components within normal limits  CBC WITH DIFFERENTIAL/PLATELET - Abnormal; Notable for the following:    RBC 2.95 (*)    Hemoglobin 9.9 (*)    HCT 29.9 (*)    MCV 101.4 (*)    All other components within normal limits  URINALYSIS, ROUTINE W REFLEX MICROSCOPIC - Abnormal; Notable for the following:    Color, Urine STRAW (*)    Hgb urine dipstick SMALL (*)    Squamous Epithelial / LPF 0-5 (*)    All other components within normal limits  URINE CULTURE  TROPONIN I    EKG  EKG Interpretation  Date/Time:  Tuesday May 14 2016 03:06:15 EST Ventricular Rate:  81 PR Interval:    QRS Duration: 80 QT Interval:  376 QTC Calculation: 437 R Axis:   -19 Text Interpretation:  Sinus rhythm Left ventricular hypertrophy No acute changes No old tracing to compare Confirmed  by Kathrynn Humble, MD, Thelma Comp [phone]) on 05/14/2016 4:20:47 AM Also confirmed by Kathrynn Humble, MD, Thelma Comp [phone]), editor [place] [facility], [place] [phone])  on 05/14/2016 7:50:49 AM       Radiology Dg Chest 2 View  Result Date: 05/14/2016 CLINICAL DATA:  Cough.  Back pain. EXAM: CHEST  2 VIEW COMPARISON:  Chest radiograph 04/20/2015 FINDINGS: New volume loss in the right lung with elevation of the right hemidiaphragm and patchy right middle lobe opacities. There is right hilar prominence. The heart is normal in size. There is atherosclerosis of the thoracic aorta. Questionable retrocardiac atelectasis at the left lung base. No convincing pleural effusion. The bones are under mineralized. Limited spine assessment due to bony under mineralization. IMPRESSION: Right lung volume loss with patchy right middle lobe opacities and right hilar prominence. This may be due to atelectasis or airspace disease, however central obstructing lesion could produce a similar appearance. Recommend chest CT (preferably with contrast) for further evaluation. Aortic atherosclerosis. Electronically Signed   By: Jeb Levering M.D.   On: 05/14/2016 02:01   Ct Chest W Contrast  Result Date: 05/14/2016 CLINICAL DATA:  Acute onset of cough and left-sided chest and back pain. Assess right hilar prominence noted on chest radiograph. Initial encounter. EXAM: CT CHEST WITH CONTRAST TECHNIQUE: Multidetector CT imaging of the chest was performed during intravenous contrast administration. CONTRAST:  45mL ISOVUE-300 IOPAMIDOL (ISOVUE-300) INJECTION 61% COMPARISON:  Chest radiograph performed earlier today at 1:37 a.m. FINDINGS: Cardiovascular: The heart is normal in size. Scattered calcification is noted along the thoracic aorta. The great vessels are grossly unremarkable in appearance. Mediastinum/Nodes: A 1.2 cm right hilar node is noted. No mediastinal lymphadenopathy is seen. No pericardial effusion is identified. The thyroid gland is unremarkable. No  axillary lymphadenopathy is appreciated. Lungs/Pleura: Mild scarring is noted at the lung bases, more prominent on the right. The lungs are otherwise clear. No pleural effusion or pneumothorax is seen. No masses are identified. Upper Abdomen: The visualized portions of the liver and the spleen are unremarkable in appearance. The gallbladder is grossly unremarkable. The visualized portions the pancreas, adrenal glands and kidneys are grossly unremarkable. Scattered calcification is noted  along the proximal abdominal aorta. Musculoskeletal: There is severe diffuse osteopenia involving the thoracic vertebral bodies, with multiple chronic compression deformities along the lower thoracic spine. Diffuse lucency is noted throughout the vertebral bodies. This may very well simply reflect severe osteopenia, though an underlying lytic or marrow infiltrative process cannot be excluded. The visualized musculature is unremarkable in appearance. IMPRESSION: 1. Apparent right hilar prominence reflects normal vasculature, along with a 1.2 cm right hilar node, nonspecific in appearance. 2. Mild scarring at the lung bases, more prominent on the right. Lungs otherwise clear. 3. Severe diffuse osteopenia involving the thoracic vertebral bodies, with multiple chronic compression deformities along the lower thoracic spine. Diffuse lucency throughout the vertebral bodies may very well simply reflect severe osteopenia, though an underlying lytic or marrow infiltrative process cannot be entirely excluded. Would correlate with the patient's symptoms and history. Electronically Signed   By: Garald Balding M.D.   On: 05/14/2016 06:54    Procedures Procedures (including critical care time)  Medications Ordered in ED Medications  iopamidol (ISOVUE-300) 61 % injection (75 mLs  Contrast Given 05/14/16 [id number])     Initial Impression / Assessment and Plan / ED Course  I have reviewed the triage vital signs and the nursing  notes.  Pertinent labs & imaging results that were available during my care of the patient were reviewed by me and considered in my medical decision making (see chart for details).  Clinical Course as of May 14 898  Tue May 14, 2016  0858 Hb is lower than last visit. Pt denies any bloody stools. Not low enough to be the cause for the weakness. PCP f/u advised on this matter. Results shared. Hemoglobin: (!) 9.9 [AN]  0859 CT ordered.  DG Chest 2 View [AN]  [phone] Results from the ER workup discussed with the patient face to face and all questions answered to the best of my ability.  Results shared with the patient at the request of daughter in the d/c summary so that they can take it to the PCP.  Strict ER return precautions have been discussed, and patient is agreeing with the plan and is comfortable with the workup done and the recommendations from the ER.  CT Chest W Contrast [AN]    Clinical Course User Index [AN] Varney Biles, MD    DDx: Sepsis syndrome ACS syndrome ICH / Stroke CHF exacerbation Infection - pneumonia/UTI/Cellulitis Dehydration Electrolyte abnormality Tox syndrome Encephalopathy  Mediation side effects DKA Metabolic disorders including thyroid disorders, adrenal insufficiency Cancer of unknown origin / paraneoplastic process  Pt comes in with cc of weakness. History not suggestive of any specific source of infection. She is having some frequent urination - so UTI and pyelonephritis considered highest in the differential.   Final Clinical Impressions(s) / ED Diagnoses   Final diagnoses:  Essential hypertension  Generalized weakness  Osteopenia, unspecified location    New Prescriptions New Prescriptions   No medications on file   I personally performed the services described in this documentation, which was scribed in my presence. The recorded information has been reviewed and is accurate.    Varney Biles, MD 05/14/16 0900

## 2016-05-14 NOTE — Discharge Instructions (Signed)
We saw you in the ER for the weakness and elevated blood pressure.  We are not sure what is causing your symptoms. The cardiac enzyme is normal, there is no infection seen in the urine or in the lungs. WE DID SEE THAT YOUR HEMOGLOBIN HAS DROPPED TO 9 FROM 11. So please see your doctor for the low hemoglobin.  The workup in the ER is not complete, and is limited to screening for life threatening and emergent conditions only, so please see a primary care doctor for further evaluation.  The CT scan results are displayed below as per your request - please share with your primary doctor.  CT CHEST WITH CONTRAST  TECHNIQUE: Multidetector CT imaging of the chest was performed during intravenous contrast administration.  CONTRAST:  15mL ISOVUE-300 IOPAMIDOL (ISOVUE-300) INJECTION 61%  COMPARISON:  Chest radiograph performed earlier today at 1:37 a.m.  FINDINGS: Cardiovascular: The heart is normal in size. Scattered calcification is noted along the thoracic aorta. The great vessels are grossly unremarkable in appearance.  Mediastinum/Nodes: A 1.2 cm right hilar node is noted. No mediastinal lymphadenopathy is seen. No pericardial effusion is identified. The thyroid gland is unremarkable. No axillary lymphadenopathy is appreciated.  Lungs/Pleura: Mild scarring is noted at the lung bases, more prominent on the right. The lungs are otherwise clear. No pleural effusion or pneumothorax is seen. No masses are identified.  Upper Abdomen: The visualized portions of the liver and the spleen are unremarkable in appearance. The gallbladder is grossly unremarkable. The visualized portions the pancreas, adrenal glands and kidneys are grossly unremarkable. Scattered calcification is noted along the proximal abdominal aorta.  Musculoskeletal: There is severe diffuse osteopenia involving the thoracic vertebral bodies, with multiple chronic compression deformities along the lower thoracic spine.  Diffuse lucency is noted throughout the vertebral bodies. This may very well simply reflect severe osteopenia, though an underlying lytic or marrow infiltrative process cannot be excluded. The visualized musculature is unremarkable in appearance.  IMPRESSION: 1. Apparent right hilar prominence reflects normal vasculature, along with a 1.2 cm right hilar node, nonspecific in appearance. 2. Mild scarring at the lung bases, more prominent on the right. Lungs otherwise clear. 3. Severe diffuse osteopenia involving the thoracic vertebral bodies, with multiple chronic compression deformities along the lower thoracic spine. Diffuse lucency throughout the vertebral bodies may very well simply reflect severe osteopenia, though an underlying lytic or marrow infiltrative process cannot be entirely excluded. Would correlate with the patient's symptoms and history.

## 2016-05-15 LAB — URINE CULTURE

## 2016-05-16 ENCOUNTER — Telehealth: Payer: Self-pay | Admitting: *Deleted

## 2016-05-16 MED ORDER — MEMANTINE HCL 5 MG PO TABS: 5.0000 mg | ORAL_TABLET | Freq: Two times a day (BID) | ORAL | 3 refills | Status: AC

## 2016-05-16 NOTE — Telephone Encounter (Signed)
[facility] escribed to [facility] per faxed request/fim

## 2016-05-18 ENCOUNTER — Encounter: Payer: Self-pay | Admitting: Neurology

## 2016-05-20 ENCOUNTER — Other Ambulatory Visit (HOSPITAL_COMMUNITY): Payer: Self-pay | Admitting: Physician Assistant

## 2016-05-20 DIAGNOSIS — M899 Disorder of bone, unspecified: Secondary | ICD-10-CM

## 2016-05-20 DIAGNOSIS — R937 Abnormal findings on diagnostic imaging of other parts of musculoskeletal system: Secondary | ICD-10-CM

## 2016-05-21 ENCOUNTER — Telehealth: Payer: Self-pay | Admitting: *Deleted

## 2016-05-21 MED ORDER — HYDROXYZINE HCL 25 MG PO TABS: ORAL_TABLET | ORAL | 0 refills | Status: DC

## 2016-05-21 NOTE — Telephone Encounter (Signed)
See email from Debbie/fim

## 2016-05-24 ENCOUNTER — Encounter (HOSPITAL_COMMUNITY)
Admission: RE | Admit: 2016-05-24 | Discharge: 2016-05-24 | Disposition: A | Discharge status: Home / Self Care | Payer: Medicare Other | Source: Ambulatory Visit | Attending: Physician Assistant | Admitting: Physician Assistant

## 2016-05-24 DIAGNOSIS — M419 Scoliosis, unspecified: Secondary | ICD-10-CM | POA: Diagnosis not present

## 2016-05-24 DIAGNOSIS — R937 Abnormal findings on diagnostic imaging of other parts of musculoskeletal system: Secondary | ICD-10-CM | POA: Diagnosis present

## 2016-05-24 DIAGNOSIS — M899 Disorder of bone, unspecified: Secondary | ICD-10-CM | POA: Insufficient documentation

## 2016-05-24 MED ORDER — TECHNETIUM TC 99M MEDRONATE IV KIT: 25.0000 | PACK | Freq: Once | INTRAVENOUS | Status: DC | PRN

## 2016-05-27 ENCOUNTER — Telehealth: Payer: Self-pay | Admitting: Oncology

## 2016-05-27 ENCOUNTER — Encounter: Payer: Self-pay | Admitting: Oncology

## 2016-05-27 NOTE — Telephone Encounter (Signed)
Spoke with dtr in law Debbie re new patient appointment with Dr. Alen Blew fon 06/06/2016 @ 2 pm to arrive 1:30 pm. Demographic and insurance information confirmed.

## 2016-05-29 ENCOUNTER — Emergency Department (HOSPITAL_COMMUNITY)
Admission: EM | Admit: 2016-05-29 | Discharge: 2016-05-29 | Disposition: A | Discharge status: Home / Self Care | Payer: Medicare Other | Source: Home / Self Care | Attending: Emergency Medicine | Admitting: Emergency Medicine

## 2016-05-29 ENCOUNTER — Encounter (HOSPITAL_COMMUNITY): Payer: Self-pay | Admitting: Emergency Medicine

## 2016-05-29 DIAGNOSIS — Z79899 Other long term (current) drug therapy: Secondary | ICD-10-CM

## 2016-05-29 DIAGNOSIS — M546 Pain in thoracic spine: Secondary | ICD-10-CM | POA: Insufficient documentation

## 2016-05-29 DIAGNOSIS — M549 Dorsalgia, unspecified: Secondary | ICD-10-CM | POA: Diagnosis not present

## 2016-05-29 DIAGNOSIS — E119 Type 2 diabetes mellitus without complications: Secondary | ICD-10-CM

## 2016-05-29 DIAGNOSIS — M47814 Spondylosis without myelopathy or radiculopathy, thoracic region: Secondary | ICD-10-CM

## 2016-05-29 DIAGNOSIS — G8929 Other chronic pain: Secondary | ICD-10-CM

## 2016-05-29 DIAGNOSIS — I10 Essential (primary) hypertension: Secondary | ICD-10-CM | POA: Insufficient documentation

## 2016-05-29 DIAGNOSIS — G893 Neoplasm related pain (acute) (chronic): Secondary | ICD-10-CM | POA: Diagnosis not present

## 2016-05-29 LAB — URINALYSIS, ROUTINE W REFLEX MICROSCOPIC
BILIRUBIN URINE: NEGATIVE
GLUCOSE, UA: NEGATIVE mg/dL
KETONES UR: NEGATIVE mg/dL
NITRITE: NEGATIVE
PROTEIN: NEGATIVE mg/dL
Specific Gravity, Urine: 1.006 (ref 1.005–1.030)
pH: 7 (ref 5.0–8.0)

## 2016-05-29 LAB — CBC WITH DIFFERENTIAL/PLATELET
BASOS PCT: 0 %
Basophils Absolute: 0 10*3/uL (ref 0.0–0.1)
EOS ABS: 0 10*3/uL (ref 0.0–0.7)
Eosinophils Relative: 1 %
HEMATOCRIT: 30.2 % — AB (ref 36.0–46.0)
HEMOGLOBIN: 10.1 g/dL — AB (ref 12.0–15.0)
Lymphocytes Relative: 43 %
Lymphs Abs: 1.8 10*3/uL (ref 0.7–4.0)
MCH: 33.3 pg (ref 26.0–34.0)
MCHC: 33.4 g/dL (ref 30.0–36.0)
MCV: 99.7 fL (ref 78.0–100.0)
MONOS PCT: 10 %
Monocytes Absolute: 0.4 10*3/uL (ref 0.1–1.0)
NEUTROS ABS: 1.9 10*3/uL (ref 1.7–7.7)
NEUTROS PCT: 46 %
Platelets: 313 10*3/uL (ref 150–400)
RBC: 3.03 MIL/uL — AB (ref 3.87–5.11)
RDW: 14.2 % (ref 11.5–15.5)
WBC: 4.1 10*3/uL (ref 4.0–10.5)

## 2016-05-29 LAB — BASIC METABOLIC PANEL
ANION GAP: 6 (ref 5–15)
BUN: 13 mg/dL (ref 6–20)
CALCIUM: 10.4 mg/dL — AB (ref 8.9–10.3)
CHLORIDE: 101 mmol/L (ref 101–111)
CO2: 30 mmol/L (ref 22–32)
CREATININE: 0.74 mg/dL (ref 0.44–1.00)
GFR calc non Af Amer: >60 mL/min (ref >60)
Glucose, Bld: 87 mg/dL (ref 65–99)
Potassium: 2.7 mmol/L — CL (ref 3.5–5.1)
SODIUM: 137 mmol/L (ref 135–145)

## 2016-05-29 MED ORDER — POTASSIUM CHLORIDE CRYS ER 20 MEQ PO TBCR
40.0000 meq | EXTENDED_RELEASE_TABLET | Freq: Once | ORAL | Status: AC
  Administered 2016-05-29: 40 meq via ORAL
  Filled 2016-05-29: qty 2

## 2016-05-29 MED ORDER — POTASSIUM CHLORIDE CRYS ER 20 MEQ PO TBCR: 20.0000 meq | EXTENDED_RELEASE_TABLET | Freq: Two times a day (BID) | ORAL | 0 refills | Status: DC

## 2016-05-29 MED ORDER — OXYCODONE-ACETAMINOPHEN 5-325 MG PO TABS: 2.0000 | ORAL_TABLET | ORAL | 0 refills | Status: DC | PRN

## 2016-05-29 MED ORDER — MORPHINE SULFATE (PF) 4 MG/ML IV SOLN
4.0000 mg | Freq: Once | INTRAVENOUS | Status: AC
  Administered 2016-05-29: 4 mg via INTRAVENOUS
  Filled 2016-05-29: qty 1

## 2016-05-29 NOTE — Care Management (Signed)
ED CM in room to discuss Care transition options with patient and daughter-in-law Brock Mokry [phone]). Patient lives at home with son and daughter-in-law. Patient has traditional Medicare.  CM explained that as of this time patient is not meeting the criteria for inpatient admission, ED evaluation still pending.  Patient and family verbalized understanding teach back done. Patient and family are agreeable to [facility] services once  medically cleared for discharged. offered choice, [facility] selected.  CM verified contacting information. Labs  Pending for final disposition decision. CM will continue to follow.

## 2016-05-29 NOTE — Discharge Instructions (Signed)
Try 1-2 Percocet for pain every 6 hours as needed. Use this in place of your prescribed hydrocodone (vicodin). Take colace to avoid constipation. Call your pain management doctor tomorrow to discuss probable need for stronger pain medications as your current dose of vicodin is not controlling your pain.

## 2016-05-29 NOTE — Progress Notes (Signed)
CSW met with patient and patients daughter in-law, [name], regarding discharge plans. Patient current lives with daughter in-law and son who are patients primary caregiver. Patient states she usually is able to get around with a walker but has recently had more trouble. Daughter in-law states patient is unable to get around by herself. States "we have to use a computer chair to get patient from the bed to the bathroom". CSW explained Medicare guidelines and the need for 3 night inpatient stay. Daughter-in-law stated family was unable to pay privately if patient was not admitted. CSW stated ED CM could provided more information regarded home health services for patient if discharged. CSW will update RN and EDP.   Kingsley Spittle, LCSWA Clinical Social Worker [phone]

## 2016-05-29 NOTE — ED Notes (Signed)
Bed: WA01 Expected date:  Expected time:  Means of arrival:  Comments: 
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
## 2016-05-29 NOTE — ED Provider Notes (Signed)
[place] DEPT Provider Note   CSN: [id number] Arrival date & time: 05/29/16  1045     History   Chief Complaint Chief Complaint  Patient presents with  . Back Pain    HPI Andrea Burgess is a 83 y.o. female.  HPI Patient has had ongoing or progressive problems with back pain and gait dysfunction. Her pain is often in her central thoracic back or lower back and sacrum. He has become debilitating. Patient has been in pain management and is taking hydrocodone 7.5 mg every 4 hours. She is also being treated with Neurontin and baclofen. Her daughter reports that this is not helping enough for her to be functional in the home. She tries to assist her to the bathroom it is very difficult for her to get there, she uses a walker, and once there, she has severe difficulty lowering herself onto the commode. Today her pain was much worse in the thoracic back and she really couldn't even get out of bed. They called their family doctor and were devised to come to the emergency department to seek treatment and possible assistance with nursing home placement if we thought it was needed. At this time, the patient cannot localize where the pain is at its worst. She reports it moves from her mid back and lower back and hips. This morning the worst of the pain seemed to be in her thoracic back. Past Medical History:  Diagnosis Date  . Diabetes mellitus without complication ([place])   . Hypertension   . Vision abnormalities     Patient Active Problem List   Diagnosis Date Noted  . Blood in the urine 09/12/2015  . Alzheimer's disease 06/01/2015  . Midline low back pain with left-sided sciatica 06/01/2015  . Subacromial bursitis 06/01/2015  . History of recent steroid use 06/01/2015  . Chronic low back pain 05/13/2015  . Gastro-esophageal reflux disease without esophagitis 05/13/2015  . Lumbosacral spondylosis 05/08/2015  . Abnormality of shape of nail 04/15/2015  . Adjustment insomnia 04/15/2015    . Allergic rhinitis 04/15/2015  . Type 2 diabetes mellitus ([place]) 04/15/2015  . Essential (primary) hypertension 04/15/2015  . Bladder cystocele 04/15/2015  . Hypercholesterolemia 04/15/2015  . Bad memory 04/15/2015  . 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 Vitamin B12 deficiency anemia 04/15/2015  . Memory loss 02/28/2015  . Low serum vitamin B12 02/28/2015  . Trochanteric bursitis of left hip 02/28/2015  . Gait disturbance 02/28/2015  . Snoring 02/28/2015  . Depressed affect 02/28/2015  . Abnormal gait 02/28/2015  . Bursitis, trochanteric 02/28/2015    Past Surgical History:  Procedure Laterality Date  . BREAST LUMPECTOMY Left   . TONSILLECTOMY    . VESICOVAGINAL FISTULA CLOSURE W/ TAH      OB History    No data available       Home Medications    Prior to Admission medications   Medication Sig Start Date End Date Taking? Authorizing Provider  acetaminophen (TYLENOL) 325 MG tablet Take 650 mg by mouth every 6 (six) hours as needed for moderate pain or headache.    Yes Historical Provider, MD  baclofen (LIORESAL) 10 MG tablet Take 10 mg by mouth 2 (two) times daily.   Yes Historical Provider, MD  calcium-vitamin D (OSCAL WITH D) 500-200 MG-UNIT tablet Take 1 tablet by mouth 2 (two) times daily.   Yes Historical Provider, MD  Cyanocobalamin (VITAMIN B12 PO) Take 1 tablet by mouth daily.   Yes Historical Provider, MD  diclofenac (VOLTAREN) 75 MG  EC tablet Take 75 mg by mouth 2 (two) times daily.  05/08/15  Yes Historical Provider, MD  donepezil (ARICEPT) 10 MG tablet Take 1 tablet (10 mg total) by mouth at bedtime. Patient taking differently: Take 10 mg by mouth daily with breakfast.  11/13/15  Yes Britt Bottom, MD  gabapentin (NEURONTIN) 300 MG capsule Take 300 mg by mouth 4 (four) times daily.   Yes Historical Provider, MD  HYDROcodone-acetaminophen (NORCO) 7.5-325 MG tablet Take 1 tablet by mouth 6 (six) times daily. 05/21/16  Yes Historical  Provider, MD  hydrOXYzine (ATARAX/VISTARIL) 25 MG tablet Take one-half tablet at bedtime to help with anxiety, sleep.  May take one additional dose during the day if needed. Patient taking differently: Take 12.5 mg by mouth 2 (two) times daily.  05/21/16  Yes Britt Bottom, MD  ibuprofen (ADVIL,MOTRIN) 200 MG tablet Take 400 mg by mouth every 6 (six) hours as needed for headache or moderate pain.   Yes Historical Provider, MD  loratadine (KLS ALLERCLEAR) 10 MG tablet Take 10 mg by mouth daily.   Yes Historical Provider, MD  losartan (COZAAR) 25 MG tablet Take 25 mg by mouth daily.   Yes Historical Provider, MD  memantine (NAMENDA) 5 MG tablet Take 1 tablet (5 mg total) by mouth 2 (two) times daily. 05/16/16  Yes Britt Bottom, MD  omeprazole (PRILOSEC) 40 MG capsule Take 40 mg by mouth daily.    Yes Historical Provider, MD  pravastatin (PRAVACHOL) 40 MG tablet Take 40 mg by mouth daily.   Yes Historical Provider, MD    Family History Family History  Problem Relation Age of Onset  . Healthy Mother     Social History Social History  Substance Use Topics  . Smoking status: Never Smoker  . Smokeless tobacco: Never Used  . Alcohol use No     Allergies   Tramadol   Review of Systems Review of Systems 10 Systems reviewed and are negative for acute change except as noted in the HPI.   Physical Exam Updated Vital Signs BP 162/79   Pulse 77   Temp 98.1 F (36.7 C) (Oral)   Resp 15   SpO2 99%   Physical Exam  Constitutional: She is oriented to person, place, and time.  Patient is alert and nontoxic. No respiratory distress. No significant pain at rest supine.  HENT:  Head: Normocephalic and atraumatic.  Mouth/Throat: Oropharynx is clear and moist.  Eyes: EOM are normal. Pupils are equal, round, and reactive to light.  Cardiovascular: Normal rate, regular rhythm, normal heart sounds and intact distal pulses.   Occasional ectopy.  Pulmonary/Chest: Effort normal and breath  sounds normal. No respiratory distress. She has no wheezes. She has no rales. She exhibits no tenderness.  Abdominal: Soft. She exhibits no distension. There is no tenderness. There is no guarding.  Musculoskeletal: She exhibits edema.  I did have the patient sit up in the stretcher. She sat up with assistance and holding the bed rails. Patient the back shows significant scoliosis of the thorax. She endorsed pain to palpation over the bony elements of approximately T8 to T12. She also endorsed tenderness of the lower lumbar and mid sacral region. She did not endorse significant tenderness of the thorax away from the midline.  2+ pitting edema bilateral lower extremities. No calf tender. Skin condition is very good. Pain is somewhat reproducible with straight leg raise beyond 30.  Neurological: She is alert and oriented to person, place, and time. No  cranial nerve deficit. She exhibits normal muscle tone. Coordination normal.  Skin: Skin is warm and dry.  Psychiatric: She has a normal mood and affect.     ED Treatments / Results  Labs (all labs ordered are listed, but only abnormal results are displayed) Labs Reviewed  BASIC METABOLIC PANEL - Abnormal; Notable for the following:       Result Value   Potassium 2.7 (*)    Calcium 10.4 (*)    All other components within normal limits  CBC WITH DIFFERENTIAL/PLATELET - Abnormal; Notable for the following:    RBC 3.03 (*)    Hemoglobin 10.1 (*)    HCT 30.2 (*)    All other components within normal limits  URINALYSIS, ROUTINE W REFLEX MICROSCOPIC - Abnormal; Notable for the following:    Color, Urine STRAW (*)    Hgb urine dipstick SMALL (*)    Leukocytes, UA TRACE (*)    Bacteria, UA RARE (*)    Squamous Epithelial / LPF 0-5 (*)    All other components within normal limits    EKG  EKG Interpretation None       Radiology No results found.  Procedures Procedures (including critical care time)  Medications Ordered in  ED Medications  potassium chloride SA (K-DUR,KLOR-CON) CR tablet 40 mEq (not administered)  morphine 4 MG/ML injection 4 mg (4 mg Intravenous Given 05/29/16 1419)     Initial Impression / Assessment and Plan / ED Course  I have reviewed the triage vital signs and the nursing notes.  Pertinent labs & imaging results that were available during my care of the patient were reviewed by me and considered in my medical decision making (see chart for details).    After morphine administration, patient is ambulating to the bathroom with her walker. Pain is controlled and gait is at baseline.  Per social work, patient does not qualify for reimbursed NH care at this time. They will assist in getting additional in home assistance.  Final Clinical Impressions(s) / ED Diagnoses   Final diagnoses:  Spondylosis of thoracic region without myelopathy or radiculopathy  Chronic midline thoracic back pain  Has had chronic and worsening back and hip pain. This has been impeding the patient's home function with ambulation and transfers to toileting. She is currently under the care of pain management and also getting further evaluation by hematology for possible bone tumors (bone scan activity within anterior costochondral junctions of the ribs as well as SI joints.). After reviewing the pain with the patient, it seems somewhat migratory. Sometimes it is her thoracic back that is significantly pain limiting, other times it is her hips and sacrum. She has already had bone scan and CT scan 2\27\2018 showing severe diffuse osteopenia of the thoracic vertebral bodies multiple chronic compression deformities. At this time, I feel the patient can be adequate pain control she is stable to be at home and continued diagnostic evaluation. She did get good pain control morphine and then was up and ambulating at baseline. At this time I will have her take 2 Percocet rather than Vicodin 7.5. Her daughters counseled on discussing  pain management with her pain management physician and perhaps considering transitioning to fentanyl. At this time she did not qualify for reimbursed nursing home admission per social work. Will continue to try to assist in increased in home care and they will work with the family doctor to determine patient's level of care needs. Discharge patient is alert and cooperative and ambulating with  her walker.  New Prescriptions New Prescriptions   No medications on file     Charlesetta Shanks, MD 05/30/16 1256

## 2016-05-30 ENCOUNTER — Other Ambulatory Visit: Payer: Self-pay | Admitting: Surgery

## 2016-05-30 DIAGNOSIS — R531 Weakness: Secondary | ICD-10-CM

## 2016-05-30 NOTE — Progress Notes (Signed)
Patient seen in the ED yesterday and discharged with care transition plan [facility] services.  Orders were not placed yesterday before discharge. ED CM and Dr. Johnney Killian sat attempting place orders after discharge. After many attempts [facility] orders placed and was signed by Dr. Johnney Killian.  CM faxed referral out to [facility] via [facility]. CM will follow up for confirmation.

## 2016-05-31 ENCOUNTER — Inpatient Hospital Stay (HOSPITAL_COMMUNITY)
Admission: EM | Admit: 2016-05-31 | Discharge: 2016-06-05 | DRG: 948 | Disposition: A | Discharge status: Skilled Nursing Facility | Payer: Medicare Other | Attending: Internal Medicine | Admitting: Internal Medicine

## 2016-05-31 ENCOUNTER — Emergency Department (HOSPITAL_COMMUNITY): Payer: Medicare Other

## 2016-05-31 ENCOUNTER — Encounter (HOSPITAL_COMMUNITY): Payer: Self-pay

## 2016-05-31 DIAGNOSIS — F0280 Dementia in other diseases classified elsewhere without behavioral disturbance: Secondary | ICD-10-CM | POA: Diagnosis present

## 2016-05-31 DIAGNOSIS — Z66 Do not resuscitate: Secondary | ICD-10-CM | POA: Diagnosis present

## 2016-05-31 DIAGNOSIS — N390 Urinary tract infection, site not specified: Secondary | ICD-10-CM

## 2016-05-31 DIAGNOSIS — K5900 Constipation, unspecified: Secondary | ICD-10-CM | POA: Diagnosis present

## 2016-05-31 DIAGNOSIS — E538 Deficiency of other specified B group vitamins: Secondary | ICD-10-CM | POA: Diagnosis present

## 2016-05-31 DIAGNOSIS — M419 Scoliosis, unspecified: Secondary | ICD-10-CM | POA: Diagnosis present

## 2016-05-31 DIAGNOSIS — G893 Neoplasm related pain (acute) (chronic): Principal | ICD-10-CM | POA: Diagnosis present

## 2016-05-31 DIAGNOSIS — G309 Alzheimer's disease, unspecified: Secondary | ICD-10-CM | POA: Diagnosis present

## 2016-05-31 DIAGNOSIS — C9000 Multiple myeloma not having achieved remission: Secondary | ICD-10-CM | POA: Diagnosis present

## 2016-05-31 DIAGNOSIS — M25559 Pain in unspecified hip: Secondary | ICD-10-CM

## 2016-05-31 DIAGNOSIS — I10 Essential (primary) hypertension: Secondary | ICD-10-CM | POA: Diagnosis present

## 2016-05-31 DIAGNOSIS — M4854XA Collapsed vertebra, not elsewhere classified, thoracic region, initial encounter for fracture: Secondary | ICD-10-CM | POA: Diagnosis present

## 2016-05-31 DIAGNOSIS — Z515 Encounter for palliative care: Secondary | ICD-10-CM | POA: Diagnosis present

## 2016-05-31 DIAGNOSIS — Z885 Allergy status to narcotic agent status: Secondary | ICD-10-CM

## 2016-05-31 DIAGNOSIS — R0781 Pleurodynia: Secondary | ICD-10-CM | POA: Diagnosis not present

## 2016-05-31 DIAGNOSIS — E785 Hyperlipidemia, unspecified: Secondary | ICD-10-CM | POA: Diagnosis present

## 2016-05-31 DIAGNOSIS — F329 Major depressive disorder, single episode, unspecified: Secondary | ICD-10-CM | POA: Diagnosis present

## 2016-05-31 DIAGNOSIS — Z923 Personal history of irradiation: Secondary | ICD-10-CM

## 2016-05-31 DIAGNOSIS — M549 Dorsalgia, unspecified: Secondary | ICD-10-CM | POA: Diagnosis not present

## 2016-05-31 DIAGNOSIS — Z79899 Other long term (current) drug therapy: Secondary | ICD-10-CM

## 2016-05-31 DIAGNOSIS — C7951 Secondary malignant neoplasm of bone: Secondary | ICD-10-CM | POA: Diagnosis present

## 2016-05-31 DIAGNOSIS — R52 Pain, unspecified: Secondary | ICD-10-CM

## 2016-05-31 DIAGNOSIS — E876 Hypokalemia: Secondary | ICD-10-CM | POA: Diagnosis present

## 2016-05-31 DIAGNOSIS — E7800 Pure hypercholesterolemia, unspecified: Secondary | ICD-10-CM | POA: Diagnosis present

## 2016-05-31 DIAGNOSIS — F419 Anxiety disorder, unspecified: Secondary | ICD-10-CM | POA: Diagnosis present

## 2016-05-31 DIAGNOSIS — M47817 Spondylosis without myelopathy or radiculopathy, lumbosacral region: Secondary | ICD-10-CM | POA: Diagnosis present

## 2016-05-31 DIAGNOSIS — M48061 Spinal stenosis, lumbar region without neurogenic claudication: Secondary | ICD-10-CM | POA: Diagnosis present

## 2016-05-31 DIAGNOSIS — R319 Hematuria, unspecified: Secondary | ICD-10-CM | POA: Diagnosis present

## 2016-05-31 DIAGNOSIS — E119 Type 2 diabetes mellitus without complications: Secondary | ICD-10-CM | POA: Diagnosis present

## 2016-05-31 DIAGNOSIS — K219 Gastro-esophageal reflux disease without esophagitis: Secondary | ICD-10-CM | POA: Diagnosis present

## 2016-05-31 LAB — CBC WITH DIFFERENTIAL/PLATELET
Basophils Absolute: 0 10*3/uL (ref 0.0–0.1)
Basophils Relative: 0 %
EOS ABS: 0.1 10*3/uL (ref 0.0–0.7)
EOS PCT: 1 %
HCT: 29.8 % — ABNORMAL LOW (ref 36.0–46.0)
HEMOGLOBIN: 9.8 g/dL — AB (ref 12.0–15.0)
LYMPHS ABS: 2.4 10*3/uL (ref 0.7–4.0)
Lymphocytes Relative: 44 %
MCH: 33.4 pg (ref 26.0–34.0)
MCHC: 32.9 g/dL (ref 30.0–36.0)
MCV: 101.7 fL — ABNORMAL HIGH (ref 78.0–100.0)
MONO ABS: 0.6 10*3/uL (ref 0.1–1.0)
MONOS PCT: 11 %
Neutro Abs: 2.3 10*3/uL (ref 1.7–7.7)
Neutrophils Relative %: 44 %
PLATELETS: 302 10*3/uL (ref 150–400)
RBC: 2.93 MIL/uL — ABNORMAL LOW (ref 3.87–5.11)
RDW: 14.7 % (ref 11.5–15.5)
WBC: 5.3 10*3/uL (ref 4.0–10.5)

## 2016-05-31 LAB — COMPREHENSIVE METABOLIC PANEL
ALBUMIN: 2.8 g/dL — AB (ref 3.5–5.0)
ALT: 11 U/L — ABNORMAL LOW (ref 14–54)
ANION GAP: 6 (ref 5–15)
AST: 19 U/L (ref 15–41)
Alkaline Phosphatase: 70 U/L (ref 38–126)
BILIRUBIN TOTAL: 0.5 mg/dL (ref 0.3–1.2)
BUN: 21 mg/dL — AB (ref 6–20)
CHLORIDE: 102 mmol/L (ref 101–111)
CO2: 28 mmol/L (ref 22–32)
Calcium: 10.7 mg/dL — ABNORMAL HIGH (ref 8.9–10.3)
Creatinine, Ser: 0.94 mg/dL (ref 0.44–1.00)
GFR calc Af Amer: >60 mL/min (ref >60)
GFR calc non Af Amer: 53 mL/min — ABNORMAL LOW (ref >60)
GLUCOSE: 85 mg/dL (ref 65–99)
POTASSIUM: 3.8 mmol/L (ref 3.5–5.1)
SODIUM: 136 mmol/L (ref 135–145)
Total Protein: 10 g/dL — ABNORMAL HIGH (ref 6.5–8.1)

## 2016-05-31 LAB — URINALYSIS, ROUTINE W REFLEX MICROSCOPIC
BILIRUBIN URINE: NEGATIVE
Glucose, UA: NEGATIVE mg/dL
KETONES UR: NEGATIVE mg/dL
Nitrite: NEGATIVE
Protein, ur: NEGATIVE mg/dL
Specific Gravity, Urine: 1.016 (ref 1.005–1.030)
pH: 6 (ref 5.0–8.0)

## 2016-05-31 MED ORDER — HYDROXYZINE HCL 25 MG PO TABS
12.5000 mg | ORAL_TABLET | Freq: Two times a day (BID) | ORAL | Status: DC
  Administered 2016-05-31 – 2016-06-03 (×6): 12.5 mg via ORAL
  Filled 2016-05-31 (×6): qty 1

## 2016-05-31 MED ORDER — MORPHINE SULFATE (PF) 4 MG/ML IV SOLN
4.0000 mg | Freq: Once | INTRAVENOUS | Status: AC
  Administered 2016-05-31: 4 mg via INTRAVENOUS
  Filled 2016-05-31: qty 1

## 2016-05-31 MED ORDER — KETOROLAC TROMETHAMINE 15 MG/ML IJ SOLN
15.0000 mg | Freq: Once | INTRAMUSCULAR | Status: AC
  Administered 2016-05-31: 15 mg via INTRAVENOUS
  Filled 2016-05-31: qty 1

## 2016-05-31 MED ORDER — BACLOFEN 10 MG PO TABS
10.0000 mg | ORAL_TABLET | Freq: Two times a day (BID) | ORAL | Status: DC
  Administered 2016-06-01 – 2016-06-05 (×8): 10 mg via ORAL
  Filled 2016-05-31 (×9): qty 1

## 2016-05-31 MED ORDER — ONDANSETRON HCL 4 MG/2ML IJ SOLN
4.0000 mg | Freq: Once | INTRAMUSCULAR | Status: AC
  Administered 2016-05-31: 4 mg via INTRAVENOUS
  Filled 2016-05-31: qty 2

## 2016-05-31 MED ORDER — LOSARTAN POTASSIUM 25 MG PO TABS
25.0000 mg | ORAL_TABLET | Freq: Every day | ORAL | Status: DC
  Administered 2016-06-01 – 2016-06-05 (×5): 25 mg via ORAL
  Filled 2016-05-31 (×5): qty 1

## 2016-05-31 MED ORDER — LIP MEDEX EX OINT
TOPICAL_OINTMENT | CUTANEOUS | Status: AC
  Administered 2016-05-31
  Filled 2016-05-31: qty 7

## 2016-05-31 MED ORDER — MORPHINE SULFATE (PF) 4 MG/ML IV SOLN
4.0000 mg | INTRAVENOUS | Status: DC | PRN
Start: 2016-05-31 — End: 2016-06-01

## 2016-05-31 MED ORDER — PANTOPRAZOLE SODIUM 40 MG PO TBEC
40.0000 mg | DELAYED_RELEASE_TABLET | Freq: Every day | ORAL | Status: DC
  Administered 2016-06-01 – 2016-06-05 (×5): 40 mg via ORAL
  Filled 2016-05-31 (×5): qty 1

## 2016-05-31 MED ORDER — GABAPENTIN 300 MG PO CAPS
300.0000 mg | ORAL_CAPSULE | Freq: Four times a day (QID) | ORAL | Status: DC
  Administered 2016-05-31 – 2016-06-05 (×16): 300 mg via ORAL
  Filled 2016-05-31 (×17): qty 1

## 2016-05-31 MED ORDER — PRAVASTATIN SODIUM 20 MG PO TABS
40.0000 mg | ORAL_TABLET | Freq: Every day | ORAL | Status: DC
  Administered 2016-06-01 – 2016-06-05 (×5): 40 mg via ORAL
  Filled 2016-05-31 (×5): qty 2

## 2016-05-31 MED ORDER — MEMANTINE HCL 10 MG PO TABS
5.0000 mg | ORAL_TABLET | Freq: Two times a day (BID) | ORAL | Status: DC
  Administered 2016-05-31 – 2016-06-05 (×9): 5 mg via ORAL
  Filled 2016-05-31 (×11): qty 1

## 2016-05-31 MED ORDER — LIP MEDEX EX OINT: TOPICAL_OINTMENT | CUTANEOUS | Status: DC | PRN

## 2016-05-31 MED ORDER — ENOXAPARIN SODIUM 40 MG/0.4ML ~~LOC~~ SOLN
40.0000 mg | Freq: Every day | SUBCUTANEOUS | Status: DC
  Administered 2016-06-01 – 2016-06-04 (×3): 40 mg via SUBCUTANEOUS
  Filled 2016-05-31 (×4): qty 0.4

## 2016-05-31 MED ORDER — ONDANSETRON HCL 4 MG/2ML IJ SOLN: 4.0000 mg | Freq: Four times a day (QID) | INTRAMUSCULAR | Status: DC | PRN

## 2016-05-31 MED ORDER — LORATADINE 10 MG PO TABS
10.0000 mg | ORAL_TABLET | Freq: Every day | ORAL | Status: DC
  Administered 2016-06-01 – 2016-06-05 (×5): 10 mg via ORAL
  Filled 2016-05-31 (×5): qty 1

## 2016-05-31 MED ORDER — DONEPEZIL HCL 10 MG PO TABS
10.0000 mg | ORAL_TABLET | Freq: Every day | ORAL | Status: DC
  Administered 2016-06-01 – 2016-06-05 (×5): 10 mg via ORAL
  Filled 2016-05-31 (×5): qty 1

## 2016-05-31 MED ORDER — POTASSIUM CHLORIDE CRYS ER 20 MEQ PO TBCR
20.0000 meq | EXTENDED_RELEASE_TABLET | Freq: Two times a day (BID) | ORAL | Status: DC
  Administered 2016-06-01 – 2016-06-05 (×8): 20 meq via ORAL
  Filled 2016-05-31 (×10): qty 1

## 2016-05-31 MED ORDER — DICLOFENAC SODIUM 75 MG PO TBEC
75.0000 mg | DELAYED_RELEASE_TABLET | Freq: Two times a day (BID) | ORAL | Status: DC
  Administered 2016-06-01 – 2016-06-02 (×3): 75 mg via ORAL
  Filled 2016-05-31 (×3): qty 1
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
## 2016-05-31 NOTE — ED Triage Notes (Addendum)
Pt has multiple myeloma.  Chronic back pain.  Worsening.  Told to come to ED to see Dr. Bedelia Person
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
## 2016-06-01 ENCOUNTER — Encounter: Payer: Self-pay | Admitting: Radiation Oncology

## 2016-06-01 ENCOUNTER — Observation Stay (HOSPITAL_COMMUNITY): Payer: Medicare Other

## 2016-06-01 DIAGNOSIS — M419 Scoliosis, unspecified: Secondary | ICD-10-CM | POA: Diagnosis present

## 2016-06-01 DIAGNOSIS — G301 Alzheimer's disease with late onset: Secondary | ICD-10-CM | POA: Diagnosis not present

## 2016-06-01 DIAGNOSIS — K219 Gastro-esophageal reflux disease without esophagitis: Secondary | ICD-10-CM | POA: Diagnosis present

## 2016-06-01 DIAGNOSIS — Z923 Personal history of irradiation: Secondary | ICD-10-CM | POA: Diagnosis not present

## 2016-06-01 DIAGNOSIS — E785 Hyperlipidemia, unspecified: Secondary | ICD-10-CM | POA: Diagnosis present

## 2016-06-01 DIAGNOSIS — M4854XA Collapsed vertebra, not elsewhere classified, thoracic region, initial encounter for fracture: Secondary | ICD-10-CM | POA: Diagnosis present

## 2016-06-01 DIAGNOSIS — Z79899 Other long term (current) drug therapy: Secondary | ICD-10-CM | POA: Diagnosis not present

## 2016-06-01 DIAGNOSIS — F419 Anxiety disorder, unspecified: Secondary | ICD-10-CM | POA: Diagnosis present

## 2016-06-01 DIAGNOSIS — E7800 Pure hypercholesterolemia, unspecified: Secondary | ICD-10-CM | POA: Diagnosis present

## 2016-06-01 DIAGNOSIS — N390 Urinary tract infection, site not specified: Secondary | ICD-10-CM | POA: Diagnosis present

## 2016-06-01 DIAGNOSIS — Z510 Encounter for antineoplastic radiation therapy: Secondary | ICD-10-CM | POA: Diagnosis present

## 2016-06-01 DIAGNOSIS — I10 Essential (primary) hypertension: Secondary | ICD-10-CM | POA: Diagnosis present

## 2016-06-01 DIAGNOSIS — R0781 Pleurodynia: Secondary | ICD-10-CM | POA: Diagnosis not present

## 2016-06-01 DIAGNOSIS — Z515 Encounter for palliative care: Secondary | ICD-10-CM | POA: Diagnosis not present

## 2016-06-01 DIAGNOSIS — C9000 Multiple myeloma not having achieved remission: Secondary | ICD-10-CM | POA: Diagnosis present

## 2016-06-01 DIAGNOSIS — Z7189 Other specified counseling: Secondary | ICD-10-CM | POA: Diagnosis not present

## 2016-06-01 DIAGNOSIS — F0280 Dementia in other diseases classified elsewhere without behavioral disturbance: Secondary | ICD-10-CM | POA: Diagnosis present

## 2016-06-01 DIAGNOSIS — Z885 Allergy status to narcotic agent status: Secondary | ICD-10-CM | POA: Diagnosis not present

## 2016-06-01 DIAGNOSIS — M47817 Spondylosis without myelopathy or radiculopathy, lumbosacral region: Secondary | ICD-10-CM | POA: Diagnosis present

## 2016-06-01 DIAGNOSIS — K5900 Constipation, unspecified: Secondary | ICD-10-CM | POA: Diagnosis present

## 2016-06-01 DIAGNOSIS — G893 Neoplasm related pain (acute) (chronic): Secondary | ICD-10-CM | POA: Diagnosis present

## 2016-06-01 DIAGNOSIS — M549 Dorsalgia, unspecified: Secondary | ICD-10-CM | POA: Diagnosis present

## 2016-06-01 DIAGNOSIS — M25559 Pain in unspecified hip: Secondary | ICD-10-CM | POA: Diagnosis not present

## 2016-06-01 DIAGNOSIS — G309 Alzheimer's disease, unspecified: Secondary | ICD-10-CM | POA: Diagnosis present

## 2016-06-01 DIAGNOSIS — C7951 Secondary malignant neoplasm of bone: Secondary | ICD-10-CM | POA: Diagnosis present

## 2016-06-01 DIAGNOSIS — R319 Hematuria, unspecified: Secondary | ICD-10-CM | POA: Diagnosis present

## 2016-06-01 DIAGNOSIS — E119 Type 2 diabetes mellitus without complications: Secondary | ICD-10-CM | POA: Diagnosis present

## 2016-06-01 DIAGNOSIS — Z66 Do not resuscitate: Secondary | ICD-10-CM | POA: Diagnosis present

## 2016-06-01 DIAGNOSIS — E876 Hypokalemia: Secondary | ICD-10-CM | POA: Diagnosis present

## 2016-06-01 LAB — CBC
HCT: 27.3 % — ABNORMAL LOW (ref 36.0–46.0)
HEMOGLOBIN: 9.2 g/dL — AB (ref 12.0–15.0)
MCH: 34.2 pg — ABNORMAL HIGH (ref 26.0–34.0)
MCHC: 33.7 g/dL (ref 30.0–36.0)
MCV: 101.5 fL — ABNORMAL HIGH (ref 78.0–100.0)
Platelets: 292 10*3/uL (ref 150–400)
RBC: 2.69 MIL/uL — ABNORMAL LOW (ref 3.87–5.11)
RDW: 14.8 % (ref 11.5–15.5)
WBC: 5 10*3/uL (ref 4.0–10.5)

## 2016-06-01 LAB — GLUCOSE, CAPILLARY
GLUCOSE-CAPILLARY: 105 mg/dL — AB (ref 65–99)
GLUCOSE-CAPILLARY: 84 mg/dL (ref 65–99)
Glucose-Capillary: 94 mg/dL (ref 65–99)
Glucose-Capillary: 111 mg/dL — ABNORMAL HIGH (ref 65–99)

## 2016-06-01 LAB — BASIC METABOLIC PANEL
ANION GAP: 6 (ref 5–15)
BUN: 17 mg/dL (ref 6–20)
CALCIUM: 10.2 mg/dL (ref 8.9–10.3)
CHLORIDE: 101 mmol/L (ref 101–111)
CO2: 29 mmol/L (ref 22–32)
CREATININE: 0.82 mg/dL (ref 0.44–1.00)
GFR calc non Af Amer: >60 mL/min (ref >60)
Glucose, Bld: 86 mg/dL (ref 65–99)
Potassium: 3.4 mmol/L — ABNORMAL LOW (ref 3.5–5.1)
SODIUM: 136 mmol/L (ref 135–145)

## 2016-06-01 MED ORDER — GADOBENATE DIMEGLUMINE 529 MG/ML IV SOLN
13.0000 mL | Freq: Once | INTRAVENOUS | Status: AC | PRN
  Administered 2016-06-01: 13 mL via INTRAVENOUS

## 2016-06-01 MED ORDER — OXYCODONE-ACETAMINOPHEN 5-325 MG PO TABS
1.0000 | ORAL_TABLET | ORAL | Status: DC | PRN
  Administered 2016-06-01 – 2016-06-02 (×2): 1 via ORAL
  Filled 2016-06-01 (×2): qty 1

## 2016-06-01 MED ORDER — DEXAMETHASONE 1 MG/ML PO CONC
4.0000 mg | Freq: Two times a day (BID) | ORAL | Status: DC
  Administered 2016-06-01 – 2016-06-05 (×8): 4 mg via ORAL
  Filled 2016-06-01 (×11): qty 4

## 2016-06-01 MED ORDER — MORPHINE SULFATE (PF) 4 MG/ML IV SOLN
2.0000 mg | INTRAVENOUS | Status: DC | PRN
  Administered 2016-06-01: 13:00:00 2 mg via INTRAVENOUS
  Filled 2016-06-01: qty 1

## 2016-06-01 MED ORDER — NITROFURANTOIN MONOHYD MACRO 100 MG PO CAPS
100.0000 mg | ORAL_CAPSULE | Freq: Two times a day (BID) | ORAL | Status: DC
  Administered 2016-06-01 – 2016-06-05 (×8): 100 mg via ORAL
  Filled 2016-06-01 (×10): qty 1

## 2016-06-01 NOTE — Progress Notes (Signed)
PT Cancellation Note  Patient Details Name: Andrea Burgess MRN: [id number] DOB: [birth year]   Cancelled Treatment:    Reason Eval/Treat Not Completed: Patient at procedure or test/unavailable (pt at MRI, will attempt later today. )   Philomena Doheny 06/01/2016, 11:14 AM [phone]
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
## 2016-06-01 NOTE — Consult Note (Signed)
Reason for Consult: Evaluate right hip bony lesion, question need for prophylactic fixation for impending pathologic fracture Referring Physician: [name] is an 83 y.o. female.  HPI: The patient is a very pleasant 83 year old female with recent diagnosis of multiple myeloma. She has had progressive back pain over the past several months, worsening over the last few weeks to the point where she is having a difficult time getting around. She was found on MRI to have lytic lesions with some compression fractures in the back and a plasmacytoma. Scan also showed the right proximal femur to have some increased uptake concerning for involvement with multiple myeloma. I was consult and to evaluate for risk of possible impending pathologic fracture and consideration of prophylactic fixation. Patient denies any pain in the hip thigh or right leg. She notes all of her pain is in the back.  Past Medical History:  Diagnosis Date  . Diabetes mellitus without complication ([place])   . Hypertension   . Vision abnormalities     Past Surgical History:  Procedure Laterality Date  . BREAST LUMPECTOMY Left   . TONSILLECTOMY    . VESICOVAGINAL FISTULA CLOSURE W/ TAH      Family History  Problem Relation Age of Onset  . Healthy Mother     Social History:  reports that she has never smoked. She has never used smokeless tobacco. She reports that she does not drink alcohol or use drugs.  Allergies:  Allergies  Allergen Reactions  . Tramadol Nausea And Vomiting    Medications: I have reviewed the patient's current medications.  Results for orders placed or performed during the hospital encounter of 05/31/16 (from the past 48 hour(s))  CBC with Differential     Status: Abnormal   Collection Time: 05/31/16  5:58 PM  Result Value Ref Range   WBC 5.3 4.0 - 10.5 K/uL   RBC 2.93 (L) 3.87 - 5.11 MIL/uL   Hemoglobin 9.8 (L) 12.0 - 15.0 g/dL   HCT 29.8 (L) 36.0 - 46.0 %   MCV 101.7 (H) 78.0 -  100.0 fL   MCH 33.4 26.0 - 34.0 pg   MCHC 32.9 30.0 - 36.0 g/dL   RDW 14.7 11.5 - 15.5 %   Platelets 302 150 - 400 K/uL   Neutrophils Relative % 44 %   Neutro Abs 2.3 1.7 - 7.7 K/uL   Lymphocytes Relative 44 %   Lymphs Abs 2.4 0.7 - 4.0 K/uL   Monocytes Relative 11 %   Monocytes Absolute 0.6 0.1 - 1.0 K/uL   Eosinophils Relative 1 %   Eosinophils Absolute 0.1 0.0 - 0.7 K/uL   Basophils Relative 0 %   Basophils Absolute 0.0 0.0 - 0.1 K/uL  Comprehensive metabolic panel     Status: Abnormal   Collection Time: 05/31/16  5:58 PM  Result Value Ref Range   Sodium 136 135 - 145 mmol/L   Potassium 3.8 3.5 - 5.1 mmol/L   Chloride 102 101 - 111 mmol/L   CO2 28 22 - 32 mmol/L   Glucose, Bld 85 65 - 99 mg/dL   BUN 21 (H) 6 - 20 mg/dL   Creatinine, Ser 0.94 0.44 - 1.00 mg/dL   Calcium 10.7 (H) 8.9 - 10.3 mg/dL   Total Protein 10.0 (H) 6.5 - 8.1 g/dL   Albumin 2.8 (L) 3.5 - 5.0 g/dL   AST 19 15 - 41 U/L   ALT 11 (L) 14 - 54 U/L   Alkaline Phosphatase 70 38 - 126  U/L   Total Bilirubin 0.5 0.3 - 1.2 mg/dL   GFR calc non Af Amer 53 (L) >60 mL/min   GFR calc Af Amer >60 >60 mL/min    Comment: (NOTE) The eGFR has been calculated using the CKD EPI equation. This calculation has not been validated in all clinical situations. eGFR's persistently <60 mL/min signify possible Chronic Kidney Disease.    Anion gap 6 5 - 15  Urinalysis, Routine w reflex microscopic     Status: Abnormal   Collection Time: 05/31/16  6:27 PM  Result Value Ref Range   Color, Urine YELLOW YELLOW   APPearance HAZY (A) CLEAR   Specific Gravity, Urine 1.016 1.005 - 1.030   pH 6.0 5.0 - 8.0   Glucose, UA NEGATIVE NEGATIVE mg/dL   Hgb urine dipstick LARGE (A) NEGATIVE   Bilirubin Urine NEGATIVE NEGATIVE   Ketones, ur NEGATIVE NEGATIVE mg/dL   Protein, ur NEGATIVE NEGATIVE mg/dL   Nitrite NEGATIVE NEGATIVE   Leukocytes, UA SMALL (A) NEGATIVE   RBC / HPF 0-5 0 - 5 RBC/hpf   WBC, UA 6-30 0 - 5 WBC/hpf   Bacteria, UA  RARE (A) NONE SEEN   Squamous Epithelial / LPF 0-5 (A) NONE SEEN   Mucous PRESENT    Hyaline Casts, UA PRESENT   CBC     Status: Abnormal   Collection Time: 06/01/16  4:52 AM  Result Value Ref Range   WBC 5.0 4.0 - 10.5 K/uL   RBC 2.69 (L) 3.87 - 5.11 MIL/uL   Hemoglobin 9.2 (L) 12.0 - 15.0 g/dL   HCT 27.3 (L) 36.0 - 46.0 %   MCV 101.5 (H) 78.0 - 100.0 fL   MCH 34.2 (H) 26.0 - 34.0 pg   MCHC 33.7 30.0 - 36.0 g/dL   RDW 14.8 11.5 - 15.5 %   Platelets 292 150 - 400 K/uL  Basic metabolic panel     Status: Abnormal   Collection Time: 06/01/16  4:52 AM  Result Value Ref Range   Sodium 136 135 - 145 mmol/L   Potassium 3.4 (L) 3.5 - 5.1 mmol/L   Chloride 101 101 - 111 mmol/L   CO2 29 22 - 32 mmol/L   Glucose, Bld 86 65 - 99 mg/dL   BUN 17 6 - 20 mg/dL   Creatinine, Ser 0.82 0.44 - 1.00 mg/dL   Calcium 10.2 8.9 - 10.3 mg/dL   GFR calc non Af Amer >60 >60 mL/min   GFR calc Af Amer >60 >60 mL/min    Comment: (NOTE) The eGFR has been calculated using the CKD EPI equation. This calculation has not been validated in all clinical situations. eGFR's persistently <60 mL/min signify possible Chronic Kidney Disease.    Anion gap 6 5 - 15  Glucose, capillary     Status: None   Collection Time: 06/01/16  8:44 AM  Result Value Ref Range   Glucose-Capillary 94 65 - 99 mg/dL  Glucose, capillary     Status: Abnormal   Collection Time: 06/01/16 12:42 PM  Result Value Ref Range   Glucose-Capillary 105 (H) 65 - 99 mg/dL  Glucose, capillary     Status: None   Collection Time: 06/01/16  4:43 PM  Result Value Ref Range   Glucose-Capillary 84 65 - 99 mg/dL    Dg Thoracic Spine 2 View  Result Date: 05/31/2016 CLINICAL DATA:  Multiple myeloma with back pain EXAM: THORACIC SPINE 2 VIEWS COMPARISON:  CT 05/14/2016 FINDINGS: Twelve rib pairs. Mild to moderate scoliosis  of the spine. There is osteopenia. Mild kyphosis. Mild compression at T12 and T11, grossly similar to recent chest CT. IMPRESSION:  Diffuse osteopenia.  Scoliosis and kyphosis. Mild compression deformities at T11 and T12, grossly similar to recent CT. Electronically Signed   By: Donavan Foil M.D.   On: 05/31/2016 19:50   Dg Lumbar Spine 2-3 Views  Result Date: 05/31/2016 CLINICAL DATA:  Myeloma with back pain EXAM: LUMBAR SPINE - 2-3 VIEW COMPARISON:  05/12/2015 FINDINGS: Atherosclerosis of the aorta. Levoscoliosis of the thoracolumbar spine. Diffuse osteopenia limits the exam. Lumbar alignment grossly similar. Moderate superior endplate compression at L5, progressed. Moderate compression deformity at L3, grossly stable. Moderate compression at L1, L2 and L4, progressed since prior radiographs. Mild compression T12 and moderate compression T11, progressed since prior radiographs. IMPRESSION: 1. Diffuse osteopenia limits the exam 2. Multiple mild to moderate compression fractures of the lower thoracic and lumbar spine, compared with prior radiographs from 2017, findings have progressed at T11-T12, L1-L2 and L4. Electronically Signed   By: Donavan Foil M.D.   On: 05/31/2016 19:53   Mr Thoracic Spine W Wo Contrast  Result Date: 06/01/2016 CLINICAL DATA:  Dementia. Back pain. Recent diagnosis of multiple myeloma with progressive back pain symptoms and difficulty with urination. EXAM: MRI THORACIC AND LUMBAR SPINE WITHOUT AND WITH CONTRAST TECHNIQUE: Multiplanar and multiecho pulse sequences of the thoracic and lumbar spine were obtained without and with intravenous contrast. CONTRAST:  12m MULTIHANCE GADOBENATE DIMEGLUMINE 529 MG/ML IV SOLN COMPARISON:  Radiography 05/31/2016. MRI 10/13/2015. CT chest 05/14/2016. FINDINGS: MRI THORACIC SPINE FINDINGS Alignment:  Mild curvature convex to the right. Vertebrae: Abnormal marrow signal within the thoracic region consistent with the clinical history of myeloma. Marrow is in general hypercellular. There are more prominent hypercellular a changes with some partial compression fractures at T10,  T11 and T12. These levels showed fractures on the CT study of 3 weeks ago but did not show fractures on MRI scan of July 2017. No evidence of extraosseous tumor or neural compression however. Cord:  No cord compression or primary cord lesion. Paraspinal and other soft tissues: Negative Disc levels: Minimal disc bulges. No disc herniation or compressive stenosis of the canal or foramina MRI LUMBAR SPINE FINDINGS Segmentation:  5 lumbar type vertebral bodies. Alignment:  Mild curvature. Vertebrae: Compression deformities at each level in the lumbar spine, not progressive since the previous study. Marrow signal in the lumbar spine does not suggest definite myeloma involvement. 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 Abnormal marrow signal also present in the right femoral neck, which is presumed to relate to neoplastic disease and could place the patient at increased risk of right femur fracture. This was not primarily/completely evaluated. Conus medullaris: Extends to the L1-2 level and appears normal. Paraspinal and other soft tissues: Right iliac bone plasmacytoma and right femoral neck abnormality as noted above. Disc levels: L1-2: Disc bulge without visible neural compression. L2-3:  Disc bulge.  Left lateral recess narrowing. L3-4: Disc bulge. Bilateral lateral recess narrowing without visible neural compression. L4-5: Facet arthropathy with 3 mm of anterolisthesis. Disc bulge. Narrowing of both lateral recesses. Foraminal narrowing on the left that could affect the L4 nerve root. L5-S1: No stenosis. IMPRESSION: In the thoracic spine, the marrow space in general is hypercellular. This is particularly evident at T10, T11 and T12 where I suspect there is myeloma involvement of the marrow. There is no extraosseous tumor or  compromise of the neural structures however. Partial compression fractures of T10, T11 and T12 occurred after the MRI of July  2017 and before the CT scan of 05/14/2016. In the lumbar region, there are old compression deformities throughout which appear unchanged. There is definite myeloma involvement of the right iliac bone with there is a large plasmacytoma measuring up to 8 cm. There is also abnormal marrow signal in the right femoral neck which is presumed to represent myeloma involvement. This would place the patient at increased risk of right femoral neck fracture. Electronically Signed   By: Nelson Chimes M.D.   On: 06/01/2016 12:56   Mr Lumbar Spine W Wo Contrast  Result Date: 06/01/2016 CLINICAL DATA:  Dementia. Back pain. Recent diagnosis of multiple myeloma with progressive back pain symptoms and difficulty with urination. EXAM: MRI THORACIC AND LUMBAR SPINE WITHOUT AND WITH CONTRAST TECHNIQUE: Multiplanar and multiecho pulse sequences of the thoracic and lumbar spine were obtained without and with intravenous contrast. CONTRAST:  43m MULTIHANCE GADOBENATE DIMEGLUMINE 529 MG/ML IV SOLN COMPARISON:  Radiography 05/31/2016. MRI 10/13/2015. CT chest 05/14/2016. FINDINGS: MRI THORACIC SPINE FINDINGS Alignment:  Mild curvature convex to the right. Vertebrae: Abnormal marrow signal within the thoracic region consistent with the clinical history of myeloma. Marrow is in general hypercellular. There are more prominent hypercellular a changes with some partial compression fractures at T10, T11 and T12. These levels showed fractures on the CT study of 3 weeks ago but did not show fractures on MRI scan of July 2017. No evidence of extraosseous tumor or neural compression however. Cord:  No cord compression or primary cord lesion. Paraspinal and other soft tissues: Negative Disc levels: Minimal disc bulges. No disc herniation or compressive stenosis of the canal or foramina MRI LUMBAR SPINE FINDINGS Segmentation:  5 lumbar type vertebral bodies. Alignment:  Mild curvature. Vertebrae: Compression deformities at each level in the lumbar  spine, not progressive since the previous study. Marrow signal in the lumbar spine does not suggest definite myeloma involvement. 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 Abnormal marrow signal also present in the right femoral neck, which is presumed to relate to neoplastic disease and could place the patient at increased risk of right femur fracture. This was not primarily/completely evaluated. Conus medullaris: Extends to the L1-2 level and appears normal. Paraspinal and other soft tissues: Right iliac bone plasmacytoma and right femoral neck abnormality as noted above. Disc levels: L1-2: Disc bulge without visible neural compression. L2-3:  Disc bulge.  Left lateral recess narrowing. L3-4: Disc bulge. Bilateral lateral recess narrowing without visible neural compression. L4-5: Facet arthropathy with 3 mm of anterolisthesis. Disc bulge. Narrowing of both lateral recesses. Foraminal narrowing on the left that could affect the L4 nerve root. L5-S1: No stenosis. IMPRESSION: In the thoracic spine, the marrow space in general is hypercellular. This is particularly evident at T10, T11 and T12 where I suspect there is myeloma involvement of the marrow. There is no extraosseous tumor or compromise of the neural structures however. Partial compression fractures of T10, T11 and T12 occurred after the MRI of July 2017 and before the CT scan of 05/14/2016. In the lumbar region, there are old compression deformities throughout which appear unchanged. There is definite myeloma involvement of the right iliac bone with there is a large plasmacytoma measuring up to 8 cm. There is also abnormal marrow signal in the right femoral neck which is presumed to  represent myeloma involvement. This would place the patient at increased risk of right femoral neck fracture. Electronically Signed   By: Nelson Chimes M.D.   On: 06/01/2016 12:56   Dg Hip Unilat With Pelvis 2-3  Views Right  Result Date: 06/01/2016 CLINICAL DATA:  Low back pain for 2 weeks. EXAM: DG HIP (WITH OR WITHOUT PELVIS) 2-3V RIGHT COMPARISON:  RIGHT femur pain.  Multiple myeloma. FINDINGS: Hips are located. No evidence of pelvic fracture or sacral fracture. Dedicated view of the RIGHT hip demonstrates no femoral neck fracture. Osteopenia. No convincing lucent lesion to suggest myeloma. IMPRESSION: Osteopenia. No fracture identified. No convincing lucent lesion to suggest myeloma. Electronically Signed   By: Suzy Bouchard M.D.   On: 06/01/2016 16:36   Dg Femur Port, Min 2 Views Right  Result Date: 06/01/2016 CLINICAL DATA:  83 year old female with history of lower back pain for the past 2 weeks with some radiation into the right femur. Recently diagnosed with multiple myeloma. EXAM: RIGHT FEMUR PORTABLE 2 VIEW COMPARISON:  No priors. FINDINGS: Five views of the right femur demonstrate no acute displaced fracture. Right hip is properly located. There are multiple lucent areas throughout the proximal and middle third of the right femoral diaphysis, likely to reflect areas of osseous involvement from underlying multiple myeloma. IMPRESSION: 1. No acute radiographic abnormality of the right femur. 2. Several areas of lucency in the proximal 2/3 of the right femoral diaphysis likely to reflect areas of involvement from multiple myeloma. Electronically Signed   By: Vinnie Langton M.D.   On: 06/01/2016 16:39    Review of Systems  Unable to perform ROS: Dementia   Blood pressure (!) 154/75, pulse 85, temperature 99.6 F (37.6 C), temperature source Oral, resp. rate 16, height _0  (1.626 m), weight 68 kg (150 lb), SpO2 92 %. Physical Exam  Constitutional: She appears well-developed and well-nourished.  HENT:  Head: Atraumatic.  Eyes: EOM are normal.  Cardiovascular: Intact distal pulses.   Respiratory: Effort normal.  Musculoskeletal:  Examination right lower extremity shows no shortening or  deformity. She has full hip range of motion without any pain. Axial compression and heeltap causes no pain at the hip or groin. Distally she is neurovascularly intact bilaterally.  Neurological: She is alert.  Skin: Skin is warm and dry.  Psychiatric: She has a normal mood and affect.    Assessment/Plan: I reviewed her x-rays and her MRI which shows some small areas of lytic changes a little bit further down in the diaphysis of the right femur. None of these involve more than one third of the cortical thickness. Based on Mirel classification to determine risk of pathologic fracture she would score a 6 or 7, 8 being the threshold for surgical fixation prophylactically. Therefore at this time I would not recommend prophylactic fixation, but if she began to complain of pain in the groin or thigh with ambulation on the right side this would push her above the threshold. The family was advised to keep an eye out for this and let somebody know if she starts to complain about this. I would be happy to be involved if that becomes the case. Otherwise I believe radiation would be the appropriate treatment. I discussed this with Dr. Sonny Dandy.  Andrea Burgess 06/01/2016, 4:48 PM
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
## 2016-06-01 NOTE — Progress Notes (Signed)
PT Cancellation Note  Patient Details Name: Andrea Burgess MRN: [id number] DOB: 11/30/1927   Cancelled Treatment:    Reason Eval/Treat Not Completed: Medical issues which prohibited therapy (noted MRI showed myeloma in R femoral neck and pt is at risk for fracture, will await ortho consult for weight bearing status. )   Philomena Doheny 06/01/2016, 1:53 PM  [phone]

## 2016-06-02 DIAGNOSIS — Z7189 Other specified counseling: Secondary | ICD-10-CM

## 2016-06-02 DIAGNOSIS — Z515 Encounter for palliative care: Secondary | ICD-10-CM

## 2016-06-02 DIAGNOSIS — G893 Neoplasm related pain (acute) (chronic): Principal | ICD-10-CM

## 2016-06-02 DIAGNOSIS — C9000 Multiple myeloma not having achieved remission: Secondary | ICD-10-CM

## 2016-06-02 LAB — BASIC METABOLIC PANEL
Anion gap: 6 (ref 5–15)
BUN: 21 mg/dL — AB (ref 6–20)
CALCIUM: 10.4 mg/dL — AB (ref 8.9–10.3)
CO2: 26 mmol/L (ref 22–32)
Chloride: 103 mmol/L (ref 101–111)
Creatinine, Ser: 0.83 mg/dL (ref 0.44–1.00)
GFR calc Af Amer: >60 mL/min (ref >60)
GLUCOSE: 102 mg/dL — AB (ref 65–99)
Potassium: 3.9 mmol/L (ref 3.5–5.1)
Sodium: 135 mmol/L (ref 135–145)

## 2016-06-02 LAB — GLUCOSE, CAPILLARY
GLUCOSE-CAPILLARY: 91 mg/dL (ref 65–99)
GLUCOSE-CAPILLARY: 114 mg/dL — AB (ref 65–99)
Glucose-Capillary: 101 mg/dL — ABNORMAL HIGH (ref 65–99)
Glucose-Capillary: 122 mg/dL — ABNORMAL HIGH (ref 65–99)

## 2016-06-02 MED ORDER — OXYCODONE-ACETAMINOPHEN 7.5-325 MG PO TABS
1.0000 | ORAL_TABLET | ORAL | Status: DC | PRN
  Administered 2016-06-03 – 2016-06-04 (×2): 1 via ORAL
  Filled 2016-06-02 (×2): qty 1

## 2016-06-02 MED ORDER — OXYCODONE-ACETAMINOPHEN 7.5-325 MG PO TABS
1.0000 | ORAL_TABLET | Freq: Four times a day (QID) | ORAL | Status: DC
  Administered 2016-06-03 – 2016-06-04 (×6): 1 via ORAL
  Filled 2016-06-02 (×6): qty 1
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
## 2016-06-02 NOTE — Progress Notes (Signed)
PT Cancellation Note  Patient Details Name: Liley Rake MRN: [id number] DOB: 06-14-1927   Cancelled Treatment:    Reason Eval/Treat Not Completed: Patient at procedure or test/unavailable. PT eval initiated and began intake of history and home set up with family.  MD entered to meet with family followed by another MD.  Will check back to complete PT eval.     T[place]ilayo Hagans LUBECK 06/02/2016, 10:19 AM

## 2016-06-02 NOTE — Consult Note (Signed)
[facility] NOTE  Patient Care Team: Dewayne Shorter, PA-C as PCP - General (Physician Assistant)  CHIEF COMPLAINTS/PURPOSE OF CONSULTATION:  Newly diagnosed multiple myeloma  HISTORY OF PRESENTING ILLNESS:  Andrea Burgess 83 y.o. female is here because of intractable back pain related to compression fractures from multiple myeloma. Patient lives with her son. She was having back pain issues for several months and received an injection to the back which appeared to relieve her symptoms. Her primary care physician as part of [facility] in [place]. Lab work done by them revealed an M spike of 3.8 g which was IgG kappa. Serum free light chain assay revealed that Kappa was 242 and lambda was 3.56 and the ratio 68. Total protein was 9.9 on 3 2018 with the serum calcium of 10.3 and an albumin of 3.6 She had an outpatient appointment with Dr. Osker Mason for this Thursday. Meanwhile the pain got so bad that she had to come into the hospital for pain control. MRI of the back was obtained which showed multiple compression fractures along with the increased marrow signal T10-T11 and T12 related to myeloma involvement of the bone marrow. 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 There was some concern for myeloma involvement of the right femoral neck and the question was whether this was an increased risk of right femur neck fracture. Patient was seen by orthopedics who did not think that she needs immediate surgery to stabilize the right femur. She was also seen by Dr. Sondra Come with radiation oncology regarding palliative radiation to the spine.  I reviewed her records extensively and collaborated the history with the patient.  MEDICAL HISTORY:  Past Medical History:  Diagnosis Date  . Diabetes mellitus without complication ([place])   . Hypertension   . Vision abnormalities     SURGICAL  HISTORY: Past Surgical History:  Procedure Laterality Date  . BREAST LUMPECTOMY Left   . TONSILLECTOMY    . VESICOVAGINAL FISTULA CLOSURE W/ TAH      SOCIAL HISTORY: Social History   Social History  . Marital status: Widowed    Spouse name: N/A  . Number of children: N/A  . Years of education: N/A   Occupational History  . Not on file.   Social History Main Topics  . Smoking status: Never Smoker  . Smokeless tobacco: Never Used  . Alcohol use No  . Drug use: No  . Sexual activity: Not on file   Other Topics Concern  . Not on file   Social History Narrative  . No narrative on file    FAMILY HISTORY: Family History  Problem Relation Age of Onset  . Healthy Mother     ALLERGIES:  is allergic to tramadol.  MEDICATIONS:  Current Facility-Administered Medications  Medication Dose Route Frequency Provider Last Rate Last Dose  . baclofen (LIORESAL) tablet 10 mg  10 mg Oral BID Reubin Milan, MD   10 mg at 06/01/16 2235  . dexamethasone (DECADRON) 1 MG/ML solution 4 mg  4 mg Oral Q12H Belkys A Regalado, MD   4 mg at 06/01/16 2237  . diclofenac (VOLTAREN) EC tablet 75 mg  75 mg Oral BID Reubin Milan, MD   75 mg at 06/01/16 2237  . donepezil (ARICEPT) tablet 10 mg  10 mg Oral Q breakfast Reubin Milan, MD   10 mg at 06/01/16 0851  . enoxaparin (LOVENOX) injection  40 mg  40 mg Subcutaneous QHS Reubin Milan, MD   40 mg at 06/01/16 2237  . gabapentin (NEURONTIN) capsule 300 mg  300 mg Oral QID Reubin Milan, MD   300 mg at 06/01/16 2235  . hydrOXYzine (ATARAX/VISTARIL) tablet 12.5 mg  12.5 mg Oral BID Reubin Milan, MD   12.5 mg at 06/01/16 2234  . lip balm (CARMEX) ointment   Topical PRN Reubin Milan, MD      . loratadine [facility]) tablet 10 mg  10 mg Oral Daily Reubin Milan, MD   10 mg at 06/01/16 1041  . losartan (COZAAR) tablet 25 mg  25 mg Oral Daily Reubin Milan, MD   25 mg at 06/01/16 1041  . memantine (NAMENDA) tablet  5 mg  5 mg Oral BID Reubin Milan, MD   5 mg at 06/01/16 2235  . morphine 4 MG/ML injection 2 mg  2 mg Intravenous Q2H PRN Belkys A Regalado, MD   2 mg at 06/01/16 1249  . nitrofurantoin (macrocrystal-monohydrate) (MACROBID) capsule 100 mg  100 mg Oral Q12H Reubin Milan, MD   100 mg at 06/01/16 2236  . ondansetron (ZOFRAN) injection 4 mg  4 mg Intravenous Q6H PRN Reubin Milan, MD      . oxyCODONE-acetaminophen (PERCOCET/ROXICET) 5-325 MG per tablet 1-2 tablet  1-2 tablet Oral Q4H PRN Elmarie Shiley, MD   1 tablet at 06/01/16 2233  . pantoprazole (PROTONIX) EC tablet 40 mg  40 mg Oral Daily Reubin Milan, MD   40 mg at 06/01/16 1041  . potassium chloride SA (K-DUR,KLOR-CON) CR tablet 20 mEq  20 mEq Oral BID Reubin Milan, MD   20 mEq at 06/01/16 2236  . pravastatin (PRAVACHOL) tablet 40 mg  40 mg Oral Daily Reubin Milan, MD   40 mg at 06/01/16 1000    REVIEW OF SYSTEMS:   Constitutional: Denies fevers, chills or abnormal night sweats Eyes: Denies blurriness of vision, double vision or watery eyes Ears, nose, mouth, throat, and face: Denies mucositis or sore throat Respiratory: Denies cough, dyspnea or wheezes Cardiovascular: Denies palpitation, chest discomfort or lower extremity swelling Gastrointestinal:  Denies nausea, heartburn or change in bowel habits Skin: Denies abnormal skin rashes Lymphatics: Denies new lymphadenopathy or easy bruising Neurological: Back pain and generalized weakness Behavioral/Psych: Mood is stable, no new changes , patient has chronic dementia. All other systems were reviewed with the patient and are negative.  PHYSICAL EXAMINATION: ECOG PERFORMANCE STATUS: 4 - Bedbound  Vitals:   06/01/16 2149 06/02/16 0658  BP: (!) 164/79 (!) 169/82  Pulse: 92 98  Resp: 16 16  Temp: 99.4 F (37.4 C) 99 F (37.2 C)   Filed Weights   05/31/16 2139  Weight: 150 lb (68 kg)    GENERAL:alert, no distress and comfortable SKIN: skin  color, texture, turgor are normal, no rashes or significant lesions EYES: normal, conjunctiva are pink and non-injected, sclera clear OROPHARYNX:no exudate, no erythema and lips, buccal mucosa, and tongue normal  NECK: supple, thyroid normal size, non-tender, without nodularity LYMPH:  no palpable lymphadenopathy in the cervical, axillary or inguinal LUNGS: clear to auscultation and percussion with normal breathing effort HEART: regular rate & rhythm and no murmurs and no lower extremity edema ABDOMEN:abdomen soft, non-tender and normal bowel sounds Musculoskeletal:no cyanosis of digits and no clubbing  PSYCH: alert & oriented x 3 with fluent speech NEURO: no focal motor/sensory deficits   LABORATORY DATA:  I have  reviewed the data as listed Lab Results  Component Value Date   WBC 5.0 06/01/2016   HGB 9.2 (L) 06/01/2016   HCT 27.3 (L) 06/01/2016   MCV 101.5 (H) 06/01/2016   PLT 292 06/01/2016   Lab Results  Component Value Date   NA 135 06/02/2016   K 3.9 06/02/2016   CL 103 06/02/2016   CO2 26 06/02/2016    RADIOGRAPHIC STUDIES: I have personally reviewed the radiological reports and agreed with the findings in the report.  ASSESSMENT AND PLAN:  1. Multiple myeloma IgG kappa 3.6 g M protein I discussed with the family about the diagnosis that it is a cancer of the proximal muscles of the bone marrow that produce excessive amount of antibody. Diagnosis of myeloma is determined by albumin and beta-2 microglobulin. 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 I discussed her case with orthopedics and radiation oncology. The plan is for her to undergo palliative radiation therapy to the spine for pain relief.  2. goals of care discussion: I discussed with the family extensively about the treatment options for myeloma. If they were interested in pursuing treatment that I would have prescribed her Revlimid with dexamethasone. However that would require  multiple was asked to the hospital for blood work and follow-ups.  The family is very clear that they do not want any specific aggressive interventions for treatment and that the goal of treatment for her would be to keep her from having pain.They want her to be kept comfortable. It is for this reason, I am not prescribing her any anti-myeloma therapy at this time.  We will cancel her appointment with Dr.Shaddad for this Thursday and I will consult hospice and palliative care to see her in house and assist her in the outpatient setting.   All questions were answered. The patient knows to call the clinic with any problems, questions or concerns.    Rulon Eisenmenger, MD @T @

## 2016-06-02 NOTE — Consult Note (Signed)
Reason for Consult: Multiple myeloma with metastasis to the spine and pelvis Referring Physician: Dr. [name] is an 83 y.o. female.  HPI: 83 year old female with new diagnosis of multiple myeloma presented with back pain. Patient is a chronic back pain due to scoliosis managed by an outpatient pain management Center. Underwent injection 6 months ago that gave her about 5 months worth of relief however over the last 2 months has had progressive worsening difficulty with back and leg pain to which became non-amatory presented to the hospital. The reasons for her becoming non-amatory seem to be localized to mostly right hip and leg pain. Denies any numbness tingling her feet to bowel bladder.  Past Medical History:  Diagnosis Date  . Diabetes mellitus without complication ([place])   . Hypertension   . Vision abnormalities     Past Surgical History:  Procedure Laterality Date  . BREAST LUMPECTOMY Left   . TONSILLECTOMY    . VESICOVAGINAL FISTULA CLOSURE W/ TAH      Family History  Problem Relation Age of Onset  . Healthy Mother     Social History:  reports that she has never smoked. She has never used smokeless tobacco. She reports that she does not drink alcohol or use drugs.  Allergies:  Allergies  Allergen Reactions  . Tramadol Nausea And Vomiting    Medications: I have reviewed the patient's current medications.  Results for orders placed or performed during the hospital encounter of 05/31/16 (from the past 48 hour(s))  CBC with Differential     Status: Abnormal   Collection Time: 05/31/16  5:58 PM  Result Value Ref Range   WBC 5.3 4.0 - 10.5 K/uL   RBC 2.93 (L) 3.87 - 5.11 MIL/uL   Hemoglobin 9.8 (L) 12.0 - 15.0 g/dL   HCT 29.8 (L) 36.0 - 46.0 %   MCV 101.7 (H) 78.0 - 100.0 fL   MCH 33.4 26.0 - 34.0 pg   MCHC 32.9 30.0 - 36.0 g/dL   RDW 14.7 11.5 - 15.5 %   Platelets 302 150 - 400 K/uL   Neutrophils Relative % 44 %   Neutro Abs 2.3 1.7 - 7.7 K/uL   Lymphocytes Relative 44 %   Lymphs Abs 2.4 0.7 - 4.0 K/uL   Monocytes Relative 11 %   Monocytes Absolute 0.6 0.1 - 1.0 K/uL   Eosinophils Relative 1 %   Eosinophils Absolute 0.1 0.0 - 0.7 K/uL   Basophils Relative 0 %   Basophils Absolute 0.0 0.0 - 0.1 K/uL  Comprehensive metabolic panel     Status: Abnormal   Collection Time: 05/31/16  5:58 PM  Result Value Ref Range   Sodium 136 135 - 145 mmol/L   Potassium 3.8 3.5 - 5.1 mmol/L   Chloride 102 101 - 111 mmol/L   CO2 28 22 - 32 mmol/L   Glucose, Bld 85 65 - 99 mg/dL   BUN 21 (H) 6 - 20 mg/dL   Creatinine, Ser 0.94 0.44 - 1.00 mg/dL   Calcium 10.7 (H) 8.9 - 10.3 mg/dL   Total Protein 10.0 (H) 6.5 - 8.1 g/dL   Albumin 2.8 (L) 3.5 - 5.0 g/dL   AST 19 15 - 41 U/L   ALT 11 (L) 14 - 54 U/L   Alkaline Phosphatase 70 38 - 126 U/L   Total Bilirubin 0.5 0.3 - 1.2 mg/dL   GFR calc non Af Amer 53 (L) >60 mL/min   GFR calc Af Amer >60 >60 mL/min  Comment: (NOTE) The eGFR has been calculated using the CKD EPI equation. This calculation has not been validated in all clinical situations. eGFR's persistently <60 mL/min signify possible Chronic Kidney Disease.    Anion gap 6 5 - 15  Urinalysis, Routine w reflex microscopic     Status: Abnormal   Collection Time: 05/31/16  6:27 PM  Result Value Ref Range   Color, Urine YELLOW YELLOW   APPearance HAZY (A) CLEAR   Specific Gravity, Urine 1.016 1.005 - 1.030   pH 6.0 5.0 - 8.0   Glucose, UA NEGATIVE NEGATIVE mg/dL   Hgb urine dipstick LARGE (A) NEGATIVE   Bilirubin Urine NEGATIVE NEGATIVE   Ketones, ur NEGATIVE NEGATIVE mg/dL   Protein, ur NEGATIVE NEGATIVE mg/dL   Nitrite NEGATIVE NEGATIVE   Leukocytes, UA SMALL (A) NEGATIVE   RBC / HPF 0-5 0 - 5 RBC/hpf   WBC, UA 6-30 0 - 5 WBC/hpf   Bacteria, UA RARE (A) NONE SEEN   Squamous Epithelial / LPF 0-5 (A) NONE SEEN   Mucous PRESENT    Hyaline Casts, UA PRESENT   CBC     Status: Abnormal   Collection Time: 06/01/16  4:52 AM  Result  Value Ref Range   WBC 5.0 4.0 - 10.5 K/uL   RBC 2.69 (L) 3.87 - 5.11 MIL/uL   Hemoglobin 9.2 (L) 12.0 - 15.0 g/dL   HCT 27.3 (L) 36.0 - 46.0 %   MCV 101.5 (H) 78.0 - 100.0 fL   MCH 34.2 (H) 26.0 - 34.0 pg   MCHC 33.7 30.0 - 36.0 g/dL   RDW 14.8 11.5 - 15.5 %   Platelets 292 150 - 400 K/uL  Basic metabolic panel     Status: Abnormal   Collection Time: 06/01/16  4:52 AM  Result Value Ref Range   Sodium 136 135 - 145 mmol/L   Potassium 3.4 (L) 3.5 - 5.1 mmol/L   Chloride 101 101 - 111 mmol/L   CO2 29 22 - 32 mmol/L   Glucose, Bld 86 65 - 99 mg/dL   BUN 17 6 - 20 mg/dL   Creatinine, Ser 0.82 0.44 - 1.00 mg/dL   Calcium 10.2 8.9 - 10.3 mg/dL   GFR calc non Af Amer >60 >60 mL/min   GFR calc Af Amer >60 >60 mL/min    Comment: (NOTE) The eGFR has been calculated using the CKD EPI equation. This calculation has not been validated in all clinical situations. eGFR's persistently <60 mL/min signify possible Chronic Kidney Disease.    Anion gap 6 5 - 15  Glucose, capillary     Status: None   Collection Time: 06/01/16  8:44 AM  Result Value Ref Range   Glucose-Capillary 94 65 - 99 mg/dL  Glucose, capillary     Status: Abnormal   Collection Time: 06/01/16 12:42 PM  Result Value Ref Range   Glucose-Capillary 105 (H) 65 - 99 mg/dL  Glucose, capillary     Status: None   Collection Time: 06/01/16  4:43 PM  Result Value Ref Range   Glucose-Capillary 84 65 - 99 mg/dL  Glucose, capillary     Status: Abnormal   Collection Time: 06/01/16  9:54 PM  Result Value Ref Range   Glucose-Capillary 111 (H) 65 - 99 mg/dL  Basic metabolic panel     Status: Abnormal   Collection Time: 06/02/16  7:40 AM  Result Value Ref Range   Sodium 135 135 - 145 mmol/L   Potassium 3.9 3.5 - 5.1 mmol/L  Chloride 103 101 - 111 mmol/L   CO2 26 22 - 32 mmol/L   Glucose, Bld 102 (H) 65 - 99 mg/dL   BUN 21 (H) 6 - 20 mg/dL   Creatinine, Ser 0.83 0.44 - 1.00 mg/dL   Calcium 10.4 (H) 8.9 - 10.3 mg/dL   GFR calc non  Af Amer >60 >60 mL/min   GFR calc Af Amer >60 >60 mL/min    Comment: (NOTE) The eGFR has been calculated using the CKD EPI equation. This calculation has not been validated in all clinical situations. eGFR's persistently <60 mL/min signify possible Chronic Kidney Disease.    Anion gap 6 5 - 15  Glucose, capillary     Status: Abnormal   Collection Time: 06/02/16  8:33 AM  Result Value Ref Range   Glucose-Capillary 101 (H) 65 - 99 mg/dL    Dg Thoracic Spine 2 View  Result Date: 05/31/2016 CLINICAL DATA:  Multiple myeloma with back pain EXAM: THORACIC SPINE 2 VIEWS COMPARISON:  CT 05/14/2016 FINDINGS: Twelve rib pairs. Mild to moderate scoliosis of the spine. There is osteopenia. Mild kyphosis. Mild compression at T12 and T11, grossly similar to recent chest CT. IMPRESSION: Diffuse osteopenia.  Scoliosis and kyphosis. Mild compression deformities at T11 and T12, grossly similar to recent CT. Electronically Signed   By: Donavan Foil M.D.   On: 05/31/2016 19:50   Dg Lumbar Spine 2-3 Views  Result Date: 05/31/2016 CLINICAL DATA:  Myeloma with back pain EXAM: LUMBAR SPINE - 2-3 VIEW COMPARISON:  05/12/2015 FINDINGS: Atherosclerosis of the aorta. Levoscoliosis of the thoracolumbar spine. Diffuse osteopenia limits the exam. Lumbar alignment grossly similar. Moderate superior endplate compression at L5, progressed. Moderate compression deformity at L3, grossly stable. Moderate compression at L1, L2 and L4, progressed since prior radiographs. Mild compression T12 and moderate compression T11, progressed since prior radiographs. IMPRESSION: 1. Diffuse osteopenia limits the exam 2. Multiple mild to moderate compression fractures of the lower thoracic and lumbar spine, compared with prior radiographs from 2017, findings have progressed at T11-T12, L1-L2 and L4. Electronically Signed   By: Donavan Foil M.D.   On: 05/31/2016 19:53   Mr Thoracic Spine W Wo Contrast  Result Date: 06/01/2016 CLINICAL DATA:   Dementia. Back pain. Recent diagnosis of multiple myeloma with progressive back pain symptoms and difficulty with urination. EXAM: MRI THORACIC AND LUMBAR SPINE WITHOUT AND WITH CONTRAST TECHNIQUE: Multiplanar and multiecho pulse sequences of the thoracic and lumbar spine were obtained without and with intravenous contrast. CONTRAST:  28m MULTIHANCE GADOBENATE DIMEGLUMINE 529 MG/ML IV SOLN COMPARISON:  Radiography 05/31/2016. MRI 10/13/2015. CT chest 05/14/2016. FINDINGS: MRI THORACIC SPINE FINDINGS Alignment:  Mild curvature convex to the right. Vertebrae: Abnormal marrow signal within the thoracic region consistent with the clinical history of myeloma. Marrow is in general hypercellular. There are more prominent hypercellular a changes with some partial compression fractures at T10, T11 and T12. These levels showed fractures on the CT study of 3 weeks ago but did not show fractures on MRI scan of July 2017. No evidence of extraosseous tumor or neural compression however. Cord:  No cord compression or primary cord lesion. Paraspinal and other soft tissues: Negative Disc levels: Minimal disc bulges. No disc herniation or compressive stenosis of the canal or foramina MRI LUMBAR SPINE FINDINGS Segmentation:  5 lumbar type vertebral bodies. Alignment:  Mild curvature. Vertebrae: Compression deformities at each level in the lumbar spine, not progressive since the previous study. Marrow signal in the lumbar spine does not suggest  definite myeloma involvement. 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 Abnormal marrow signal also present in the right femoral neck, which is presumed to relate to neoplastic disease and could place the patient at increased risk of right femur fracture. This was not primarily/completely evaluated. Conus medullaris: Extends to the L1-2 level and appears normal. Paraspinal and other soft tissues: Right iliac bone plasmacytoma and  right femoral neck abnormality as noted above. Disc levels: L1-2: Disc bulge without visible neural compression. L2-3:  Disc bulge.  Left lateral recess narrowing. L3-4: Disc bulge. Bilateral lateral recess narrowing without visible neural compression. L4-5: Facet arthropathy with 3 mm of anterolisthesis. Disc bulge. Narrowing of both lateral recesses. Foraminal narrowing on the left that could affect the L4 nerve root. L5-S1: No stenosis. IMPRESSION: In the thoracic spine, the marrow space in general is hypercellular. This is particularly evident at T10, T11 and T12 where I suspect there is myeloma involvement of the marrow. There is no extraosseous tumor or compromise of the neural structures however. Partial compression fractures of T10, T11 and T12 occurred after the MRI of July 2017 and before the CT scan of 05/14/2016. In the lumbar region, there are old compression deformities throughout which appear unchanged. There is definite myeloma involvement of the right iliac bone with there is a large plasmacytoma measuring up to 8 cm. There is also abnormal marrow signal in the right femoral neck which is presumed to represent myeloma involvement. This would place the patient at increased risk of right femoral neck fracture. Electronically Signed   By: Nelson Chimes M.D.   On: 06/01/2016 12:56   Mr Lumbar Spine W Wo Contrast  Result Date: 06/01/2016 CLINICAL DATA:  Dementia. Back pain. Recent diagnosis of multiple myeloma with progressive back pain symptoms and difficulty with urination. EXAM: MRI THORACIC AND LUMBAR SPINE WITHOUT AND WITH CONTRAST TECHNIQUE: Multiplanar and multiecho pulse sequences of the thoracic and lumbar spine were obtained without and with intravenous contrast. CONTRAST:  31m MULTIHANCE GADOBENATE DIMEGLUMINE 529 MG/ML IV SOLN COMPARISON:  Radiography 05/31/2016. MRI 10/13/2015. CT chest 05/14/2016. FINDINGS: MRI THORACIC SPINE FINDINGS Alignment:  Mild curvature convex to the right.  Vertebrae: Abnormal marrow signal within the thoracic region consistent with the clinical history of myeloma. Marrow is in general hypercellular. There are more prominent hypercellular a changes with some partial compression fractures at T10, T11 and T12. These levels showed fractures on the CT study of 3 weeks ago but did not show fractures on MRI scan of July 2017. No evidence of extraosseous tumor or neural compression however. Cord:  No cord compression or primary cord lesion. Paraspinal and other soft tissues: Negative Disc levels: Minimal disc bulges. No disc herniation or compressive stenosis of the canal or foramina MRI LUMBAR SPINE FINDINGS Segmentation:  5 lumbar type vertebral bodies. Alignment:  Mild curvature. Vertebrae: Compression deformities at each level in the lumbar spine, not progressive since the previous study. Marrow signal in the lumbar spine does not suggest definite myeloma involvement. 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 Abnormal marrow signal also present in the right femoral neck, which is presumed to relate to neoplastic disease and could place the patient at increased risk of right femur fracture. This was not primarily/completely evaluated. Conus medullaris: Extends to the L1-2 level and appears normal. Paraspinal and other soft tissues: Right iliac bone plasmacytoma  and right femoral neck abnormality as noted above. Disc levels: L1-2: Disc bulge without visible neural compression. L2-3:  Disc bulge.  Left lateral recess narrowing. L3-4: Disc bulge. Bilateral lateral recess narrowing without visible neural compression. L4-5: Facet arthropathy with 3 mm of anterolisthesis. Disc bulge. Narrowing of both lateral recesses. Foraminal narrowing on the left that could affect the L4 nerve root. L5-S1: No stenosis. IMPRESSION: In the thoracic spine, the marrow space in general is hypercellular. This is particularly evident  at T10, T11 and T12 where I suspect there is myeloma involvement of the marrow. There is no extraosseous tumor or compromise of the neural structures however. Partial compression fractures of T10, T11 and T12 occurred after the MRI of July 2017 and before the CT scan of 05/14/2016. In the lumbar region, there are old compression deformities throughout which appear unchanged. There is definite myeloma involvement of the right iliac bone with there is a large plasmacytoma measuring up to 8 cm. There is also abnormal marrow signal in the right femoral neck which is presumed to represent myeloma involvement. This would place the patient at increased risk of right femoral neck fracture. Electronically Signed   By: Nelson Chimes M.D.   On: 06/01/2016 12:56   Dg Hip Unilat With Pelvis 2-3 Views Right  Result Date: 06/01/2016 CLINICAL DATA:  Low back pain for 2 weeks. EXAM: DG HIP (WITH OR WITHOUT PELVIS) 2-3V RIGHT COMPARISON:  RIGHT femur pain.  Multiple myeloma. FINDINGS: Hips are located. No evidence of pelvic fracture or sacral fracture. Dedicated view of the RIGHT hip demonstrates no femoral neck fracture. Osteopenia. No convincing lucent lesion to suggest myeloma. IMPRESSION: Osteopenia. No fracture identified. No convincing lucent lesion to suggest myeloma. Electronically Signed   By: Suzy Bouchard M.D.   On: 06/01/2016 16:36   Dg Femur Port, Min 2 Views Right  Result Date: 06/01/2016 CLINICAL DATA:  83 year old female with history of lower back pain for the past 2 weeks with some radiation into the right femur. Recently diagnosed with multiple myeloma. EXAM: RIGHT FEMUR PORTABLE 2 VIEW COMPARISON:  No priors. FINDINGS: Five views of the right femur demonstrate no acute displaced fracture. Right hip is properly located. There are multiple lucent areas throughout the proximal and middle third of the right femoral diaphysis, likely to reflect areas of osseous involvement from underlying multiple myeloma.  IMPRESSION: 1. No acute radiographic abnormality of the right femur. 2. Several areas of lucency in the proximal 2/3 of the right femoral diaphysis likely to reflect areas of involvement from multiple myeloma. Electronically Signed   By: Vinnie Langton M.D.   On: 06/01/2016 16:39    Review of Systems  Musculoskeletal: Positive for back pain and joint pain.   Blood pressure (!) 169/82, pulse 98, temperature 99 F (37.2 C), temperature source Oral, resp. rate 16, height 5' 4"  (1.626 m), weight 68 kg (150 lb), SpO2 91 %. Physical Exam  Neurological: She has normal strength. GCS eye subscore is 4. GCS verbal subscore is 5. GCS motor subscore is 6.  Is awake and alert strength 5 out of 5 iliopsoas, quads, hip she's, gastric, into tibialis, EHL.    Assessment/Plan: 24 show female with multiple myeloma metastasis T10, T11, T12 is also right iliac crest and femur. No significant pathologic fractures no significant epidural extension thecal sac or spinal cord compression. Lumbar spine shows multilevel degenerative disease and moderate spinal stenosis from this. There is no neurosurgical intervention indicated for spine. She says she has elected  to pursue palliative care. Reconsult as needed.  Andrea Burgess 06/02/2016, 10:22 AM

## 2016-06-03 ENCOUNTER — Inpatient Hospital Stay (HOSPITAL_COMMUNITY): Payer: Medicare Other

## 2016-06-03 ENCOUNTER — Ambulatory Visit
Admit: 2016-06-03 | Discharge: 2016-06-03 | Disposition: A | Discharge status: Home / Self Care | Payer: Medicare Other | Attending: Radiation Oncology | Admitting: Radiation Oncology

## 2016-06-03 DIAGNOSIS — Z510 Encounter for antineoplastic radiation therapy: Secondary | ICD-10-CM | POA: Insufficient documentation

## 2016-06-03 DIAGNOSIS — C9000 Multiple myeloma not having achieved remission: Secondary | ICD-10-CM | POA: Insufficient documentation

## 2016-06-03 LAB — GLUCOSE, CAPILLARY
GLUCOSE-CAPILLARY: 116 mg/dL — AB (ref 65–99)
GLUCOSE-CAPILLARY: 100 mg/dL — AB (ref 65–99)
Glucose-Capillary: 95 mg/dL (ref 65–99)
Glucose-Capillary: 89 mg/dL (ref 65–99)

## 2016-06-03 MED ORDER — POLYETHYLENE GLYCOL 3350 17 G PO PACK
17.0000 g | PACK | Freq: Every day | ORAL | Status: DC
  Administered 2016-06-03 – 2016-06-04 (×3): 17 g via ORAL
  Filled 2016-06-03 (×3): qty 1

## 2016-06-03 MED ORDER — OLANZAPINE 5 MG PO TBDP
5.0000 mg | ORAL_TABLET | Freq: Every day | ORAL | Status: DC
  Administered 2016-06-04: 5 mg via ORAL
  Filled 2016-06-03 (×3): qty 1

## 2016-06-03 MED ORDER — AMLODIPINE BESYLATE 5 MG PO TABS
5.0000 mg | ORAL_TABLET | Freq: Every day | ORAL | Status: DC
  Administered 2016-06-03 – 2016-06-05 (×3): 5 mg via ORAL
  Filled 2016-06-03 (×3): qty 1

## 2016-06-03 NOTE — Progress Notes (Signed)
  Radiation Oncology         [phone] ________________________________  Name: Andrea Burgess MRN: [id number]  Date: 06/03/2016  DOB: November 05, 1927  SIMULATION AND TREATMENT PLANNING NOTE - in patient    ICD-9-CM ICD-10-CM   1. Multiple myeloma not having achieved remission ([place]) 203.00 C90.00     DIAGNOSIS:  Multiple myeloma  NARRATIVE:  The patient was brought to the [facility].  Identity was confirmed.  All relevant records and images related to the planned course of therapy were reviewed.  The patient freely provided informed written consent to proceed with treatment after reviewing the details related to the planned course of therapy. The consent form was witnessed and verified by the simulation staff.  Then, the patient was set-up in a stable reproducible  supine position for radiation therapy.  CT images were obtained.  Surface markings were placed.  The CT images were loaded into the planning software.  Then the target and avoidance structures were contoured.  Treatment planning then occurred.  The radiation prescription was entered and confirmed.  Then, I designed and supervised the construction of a total of 7 medically necessary complex treatment devices.  I have requested : Isodose Plan.  I have ordered:CBC  PLAN:  The patient will receive 16 Gy in 4 fractions directed at the lower thoracic spine, right iliac plasmacytoma and right proximal femur. Treatments to start as inpatient on March 20.  -----------------------------------  Blair Promise, PhD, MD

## 2016-06-03 NOTE — Clinical Social Work Placement (Signed)
   CLINICAL SOCIAL WORK PLACEMENT  NOTE  Date:  06/03/2016  Patient Details  Name: Andrea Burgess MRN: [id number] Date of Birth: [birth year]  Clinical Social Work is seeking post-discharge placement for this patient at the [place] level of care (*CSW will initial, date and re-position this form in  chart as items are completed):  Yes   Patient/family provided with [facility] Work Department's list of facilities offering this level of care within the geographic area requested by the patient (or if unable, by the patient's family).  Yes   Patient/family informed of their freedom to choose among providers that offer the needed level of care, that participate in Medicare, Medicaid or managed care program needed by the patient, have an available bed and are willing to accept the patient.  Yes   Patient/family informed of [place] [facility]'s ownership interest in [facility] and [facility], as well as of the fact that they are under no obligation to receive care at these facilities.  PASRR submitted to EDS on 06/03/16     PASRR number received on 06/03/16     Existing PASRR number confirmed on       FL2 transmitted to all facilities in geographic area requested by pt/family on 06/03/16     FL2 transmitted to all facilities within larger geographic area on       Patient informed that his/her managed care company has contracts with or will negotiate with certain facilities, including the following:            Patient/family informed of bed offers received.  Patient chooses bed at       Physician recommends and patient chooses bed at      Patient to be transferred to   on  .  Patient to be transferred to facility by       Patient family notified on   of transfer.  Name of family member notified:        PHYSICIAN       Additional Comment:    _______________________________________________ Luretha Rued, [place] 06/03/2016,  1:27 PM
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
## 2016-06-03 NOTE — Consult Note (Signed)
Consultation Note Date: 06/03/2016   Patient Name: Andrea Burgess  DOB: 03/30/1927  MRN: [id number]  Age / Sex: 83 y.o., female  PCP: Dewayne Shorter, PA-C Referring Physician: Elmarie Shiley, MD  Reason for Consultation: Establishing goals of care, Hospice Evaluation, Non pain symptom management and Pain control  HPI/Patient Profile: 82 y.o. female  with past medical history of DM, dementia, chronic hematuria, GERD, HTN, HLD, depression, OA, chronic low back pain, and recent diagnosis multiple myeloma admitted on 05/31/2016 with worsened back pain related to metastatic disease.  Palliative consulted for goals of care.   Clinical Assessment and Goals of Care: I met today with patient in her room.  She is awake and alert but pleasantly confused.  She reports being in hospital for "back issues," but she has no further insight into her medical condition.    She asked me to call her son and daughter in law.  I called and spoke with her dtr in law, [name], who is also her HCPOA.  We discussed recent diagnosis of multiple myeloma as well as recent declines in her functional status.  Overall, [name] reports that goal moving forward is adequate pain control.  They are not interested in pursuing further workup or treatment options unless goal of those are to add comfort to her life moving forward.  SUMMARY OF RECOMMENDATIONS   - Called and discussed with patient's son and daughter in law (also HCPOA) regarding plan moving forward.  They want to ensure adequate pain control as this is their top priority.   - I discussed with family regarding heroic interventions at the end-of-life and they agree this would not be in line with prior expressed wishes for a natural death or be likely to lead to getting well enough to go back home. They were in agreement with changing CODE STATUS to DO NOT RESUSCITATE. - Plan for  follow-up meeting tomorrow at [place]:  DNR   Symptom Management:   Pain: Patient noted to be confused and is likely not reporting pain effectively.  She was taking percocet regularly at home.  Plan for scheduled percocet 7.5/325 every 6 hours.  Additional percocet 7.5/325 every 3 hours as needed.  Backup IV morphine if oral route is ineffective  Palliative Prophylaxis:   Bowel Regimen and Frequent Pain Assessment  Additional Recommendations (Limitations, Scope, Preferences):  Avoid Hospitalization  Psycho-social/Spiritual:   Desire for further Chaplaincy support:Did not address  Additional Recommendations: Education on Hospice  Prognosis:   < 6 months most likely  Discharge Planning: To Be Determined      Primary Diagnoses: Present on Admission: . Intractable back pain . Alzheimer's disease . Essential (primary) hypertension . Hypercholesterolemia . Gastro-esophageal reflux disease without esophagitis . Multiple myeloma ([place])   I have reviewed the medical record, interviewed the patient and family, and examined the patient. The following aspects are pertinent.  Past Medical History:  Diagnosis Date  . Diabetes mellitus without complication ([place])   . Hypertension   . Vision  abnormalities    Social History   Social History  . Marital status: Widowed    Spouse name: N/A  . Number of children: N/A  . Years of education: N/A   Social History Main Topics  . Smoking status: Never Smoker  . Smokeless tobacco: Never Used  . Alcohol use No  . Drug use: No  . Sexual activity: Not Asked   Other Topics Concern  . None   Social History Narrative  . None   Family History  Problem Relation Age of Onset  . Healthy Mother    Scheduled Meds: . baclofen  10 mg Oral BID  . dexamethasone  4 mg Oral Q12H  . donepezil  10 mg Oral Q breakfast  . enoxaparin (LOVENOX) injection  40 mg Subcutaneous QHS  . gabapentin  300 mg Oral  QID  . hydrOXYzine  12.5 mg Oral BID  . loratadine  10 mg Oral Daily  . losartan  25 mg Oral Daily  . memantine  5 mg Oral BID  . nitrofurantoin (macrocrystal-monohydrate)  100 mg Oral Q12H  . oxyCODONE-acetaminophen  1 tablet Oral Q6H  . pantoprazole  40 mg Oral Daily  . potassium chloride SA  20 mEq Oral BID  . pravastatin  40 mg Oral Daily   Continuous Infusions: PRN Meds:.lip balm, morphine injection, ondansetron (ZOFRAN) IV, oxyCODONE-acetaminophen Medications Prior to Admission:  Prior to Admission medications   Medication Sig Start Date End Date Taking? Authorizing Provider  baclofen (LIORESAL) 10 MG tablet Take 10 mg by mouth 2 (two) times daily.   Yes Historical Provider, MD  calcium-vitamin D (OSCAL WITH D) 500-200 MG-UNIT tablet Take 1 tablet by mouth 2 (two) times daily.   Yes Historical Provider, MD  Cyanocobalamin (VITAMIN B12 PO) Take 1 tablet by mouth daily.   Yes Historical Provider, MD  diclofenac (VOLTAREN) 75 MG EC tablet Take 75 mg by mouth 2 (two) times daily.  05/08/15  Yes Historical Provider, MD  donepezil (ARICEPT) 10 MG tablet Take 1 tablet (10 mg total) by mouth at bedtime. Patient taking differently: Take 10 mg by mouth daily with breakfast.  11/13/15  Yes Britt Bottom, MD  gabapentin (NEURONTIN) 300 MG capsule Take 300 mg by mouth 4 (four) times daily.   Yes Historical Provider, MD  hydrochlorothiazide (HYDRODIURIL) 12.5 MG tablet Take 12.5 mg by mouth daily. 04/02/16  Yes Historical Provider, MD  HYDROcodone-acetaminophen (NORCO) 7.5-325 MG tablet Take 1 tablet by mouth 6 (six) times daily. 05/21/16  Yes Historical Provider, MD  hydrOXYzine (ATARAX/VISTARIL) 25 MG tablet Take one-half tablet at bedtime to help with anxiety, sleep.  May take one additional dose during the day if needed. Patient taking differently: Take 12.5 mg by mouth 2 (two) times daily.  05/21/16  Yes Britt Bottom, MD  ibuprofen (ADVIL,MOTRIN) 200 MG tablet Take 400 mg by mouth every 6 (six)  hours as needed for headache or moderate pain.   Yes Historical Provider, MD  loratadine (KLS ALLERCLEAR) 10 MG tablet Take 10 mg by mouth daily.   Yes Historical Provider, MD  losartan (COZAAR) 25 MG tablet Take 25 mg by mouth daily.   Yes Historical Provider, MD  memantine (NAMENDA) 5 MG tablet Take 1 tablet (5 mg total) by mouth 2 (two) times daily. 05/16/16  Yes Britt Bottom, MD  omeprazole (PRILOSEC) 40 MG capsule Take 40 mg by mouth daily.    Yes Historical Provider, MD  oxyCODONE-acetaminophen (PERCOCET/ROXICET) 5-325 MG tablet Take 2 tablets by  mouth every 4 (four) hours as needed for severe pain. 05/29/16  Yes Charlesetta Shanks, MD  potassium chloride SA (K-DUR,KLOR-CON) 20 MEQ tablet Take 1 tablet (20 mEq total) by mouth 2 (two) times daily. 05/29/16  Yes Charlesetta Shanks, MD  pravastatin (PRAVACHOL) 40 MG tablet Take 40 mg by mouth daily.   Yes Historical Provider, MD   Allergies  Allergen Reactions  . Tramadol Nausea And Vomiting   Review of Systems Denies all symptoms, but patient is confused  Physical Exam  General: Alert, awake, in no acute distress.  HEENT: No bruits, no goiter, no JVD Heart: Regular rate and rhythm. No murmur appreciated. Lungs: Good air movement, clear Abdomen: Soft, nontender, nondistended, positive bowel sounds.  Ext: No significant edema Skin: Warm and dry Neuro: Grossly intact, nonfocal. Psych: Pleasantly confused  Vital Signs: BP (!) 167/75   Pulse 78   Temp 98.3 F (36.8 C) (Oral)   Resp 15   Ht 5' 4"  (1.626 m)   Wt 68 kg (150 lb)   SpO2 97%   BMI 25.75 kg/m  Pain Assessment: 0-10 POSS *See Group Information*: 1-Acceptable,Awake and alert Pain Score: 5    SpO2: SpO2: 97 % O2 Device:SpO2: 97 % O2 Flow Rate: .   IO: Intake/output summary:  Intake/Output Summary (Last 24 hours) at 06/03/16 1547 Last data filed at 06/03/16 1250  Gross per 24 hour  Intake              600 ml  Output              350 ml  Net              250 ml     LBM: Last BM Date: 06/02/16 (stated by patient) Baseline Weight: Weight: 68 kg (150 lb) Most recent weight: Weight: 68 kg (150 lb)     Palliative Assessment/Data:   Flowsheet Rows     Most Recent Value  Intake Tab  Referral Department  Oncology  Unit at Time of Referral  Orthopedic Unit  Palliative Care Primary Diagnosis  Cardiac  Date Notified  06/02/16  Palliative Care Type  New Palliative care  Reason for referral  Clarify Goals of Care, Counsel Regarding Hospice  Date of Admission  05/31/16  Date first seen by Palliative Care  06/02/16  # of days Palliative referral response time  0 Day(s)  # of days IP prior to Palliative referral  2  Clinical Assessment  Palliative Performance Scale Score  40%  Pain Max last 24 hours  Not able to report  Pain Min Last 24 hours  Not able to report  Psychosocial & Spiritual Assessment  Palliative Care Outcomes  Patient/Family meeting held?  Yes  Who was at the meeting?  Daughter in law/HCPOA  Palliative Care Outcomes  Improved pain interventions, Clarified goals of care, Counseled regarding hospice      Time In: 1820 Time Out: 1920 Time Total: 60 Greater than 50%  of this time was spent counseling and coordinating care related to the above assessment and plan.  Signed by: Micheline Rough, MD   Please contact Palliative Medicine Team phone at [phone] for questions and concerns.  For individual provider: See Shea Evans

## 2016-06-03 NOTE — Progress Notes (Signed)
Daily Progress Note   Patient Name: Andrea Burgess       Date: 06/03/2016 DOB: [birth year]  Age: 83 y.o. MRN#: [id number] Attending Physician: Elmarie Shiley, MD Primary Care Physician: Charolette Forward, PA-C Admit Date: 05/31/2016  Reason for Consultation/Follow-up: Establishing goals of care  Subjective: I met today with patient's daughter in [place], [place].   We discussed clinical course with recently diagnosed multiple myeloma as well as wishes moving forward in regard to care plan and advanced directives.    Concepts specific to code status and rehospitalization discussed.  We discussed difference between a aggressive medical intervention path and a palliative, comfort focused care path.  Values and goals of care important to patient and family were attempted to be elicited.  Questions and concerns addressed.   PMT will continue to support holistically.  See plan below.  Length of Stay: 2  Current Medications: Scheduled Meds:  . amLODipine  5 mg Oral Daily  . baclofen  10 mg Oral BID  . dexamethasone  4 mg Oral Q12H  . donepezil  10 mg Oral Q breakfast  . enoxaparin (LOVENOX) injection  40 mg Subcutaneous QHS  . gabapentin  300 mg Oral QID  . hydrOXYzine  12.5 mg Oral BID  . loratadine  10 mg Oral Daily  . losartan  25 mg Oral Daily  . memantine  5 mg Oral BID  . nitrofurantoin (macrocrystal-monohydrate)  100 mg Oral Q12H  . oxyCODONE-acetaminophen  1 tablet Oral Q6H  . pantoprazole  40 mg Oral Daily  . potassium chloride SA  20 mEq Oral BID  . pravastatin  40 mg Oral Daily    Continuous Infusions:   PRN Meds: lip balm, morphine injection, ondansetron (ZOFRAN) IV, oxyCODONE-acetaminophen  Physical Exam   General: Alert, awake, in no acute distress.   HEENT: No bruits, no goiter, no JVD Heart: Regular rate and rhythm. No murmur appreciated. Lungs: Good air movement, clear Abdomen: Soft, nontender, nondistended, positive bowel sounds.  Ext: No significant edema Skin: Warm and dry Neuro: Grossly intact, nonfocal. Psych: Pleasantly confused        Vital Signs: BP (!) 167/75   Pulse 78   Temp 98.3 F (36.8 C) (Oral)   Resp 15   Ht _0  (1.626 m)   Wt 68 kg (150 lb)  SpO2 97%   BMI 25.75 kg/m  SpO2: SpO2: 97 % O2 Device: O2 Device: Not Delivered O2 Flow Rate:    Intake/output summary:  Intake/Output Summary (Last 24 hours) at 06/03/16 1611 Last data filed at 06/03/16 1250  Gross per 24 hour  Intake              600 ml  Output              350 ml  Net              250 ml   LBM: Last BM Date: 06/02/16 (stated by patient) Baseline Weight: Weight: 68 kg (150 lb) Most recent weight: Weight: 68 kg (150 lb)       Palliative Assessment/Data:    Flowsheet Rows     Most Recent Value  Intake Tab  Referral Department  Oncology  Unit at Time of Referral  Orthopedic Unit  Palliative Care Primary Diagnosis  Cardiac  Date Notified  06/02/16  Palliative Care Type  New Palliative care  Reason for referral  Clarify Goals of Care, Counsel Regarding Hospice  Date of Admission  05/31/16  Date first seen by Palliative Care  06/02/16  # of days Palliative referral response time  0 Day(s)  # of days IP prior to Palliative referral  2  Clinical Assessment  Palliative Performance Scale Score  40%  Pain Max last 24 hours  Not able to report  Pain Min Last 24 hours  Not able to report  Psychosocial & Spiritual Assessment  Palliative Care Outcomes  Patient/Family meeting held?  Yes  Who was at the meeting?  Daughter in law/HCPOA  Palliative Care Outcomes  Improved pain interventions, Clarified goals of care, Counseled regarding hospice      Patient Active Problem List   Diagnosis Date Noted  . Intractable back pain 05/31/2016   . Multiple myeloma ([place]) 05/31/2016  . Blood in the urine 09/12/2015  . Alzheimer's disease 06/01/2015  . Midline low back pain with left-sided sciatica 06/01/2015  . Subacromial bursitis 06/01/2015  . History of recent steroid use 06/01/2015  . Chronic low back pain 05/13/2015  . Gastro-esophageal reflux disease without esophagitis 05/13/2015  . Lumbosacral spondylosis 05/08/2015  . Abnormality of shape of nail 04/15/2015  . Adjustment insomnia 04/15/2015  . Allergic rhinitis 04/15/2015  . Type 2 diabetes mellitus ([place]) 04/15/2015  . Essential (primary) hypertension 04/15/2015  . Bladder cystocele 04/15/2015  . Hypercholesterolemia 04/15/2015  . Bad memory 04/15/2015  . 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 Vitamin B12 deficiency anemia 04/15/2015  . Memory loss 02/28/2015  . Low serum vitamin B12 02/28/2015  . Trochanteric bursitis of left hip 02/28/2015  . Gait disturbance 02/28/2015  . Snoring 02/28/2015  . Depressed affect 02/28/2015  . Abnormal gait 02/28/2015  . Bursitis, trochanteric 02/28/2015    Palliative Care Assessment & Plan   Patient Profile: 83 y.o. female  with past medical history of DM, dementia, chronic hematuria, GERD, HTN, HLD, depression, OA, chronic low back pain, and recent diagnosis multiple myeloma admitted on 05/31/2016 with worsened back pain related to metastatic disease.  Palliative consulted for goals of care.  Recommendations/Plan:  DNR/DNI  Plan for completion of radiation therapy for pain management.  Family is not interested in further testing or workup if it is not going to add to patient comfort.  Adequate pain control and comfort is main goal moving forward.  We reviewed and completed MOST form today. DNR, Comfort Measures, Antibiotics to  be determined at time of infection (she has frequent UTIs), Time limited trial of IVF, no feeding tube.  Family does not want her to return to the hospital if at all  avoidable.  Plan for discharge will be SNF with plan to complete radiation and see how much functional status she can regain while allowing family to make long term arrangement.  They feel that she will need to remain in long term care facility.  We discussed initiation of her hospice benefit once she completes therapy and is no longer having her custodial care covered by insurance.  She would benefit by outpatient palliative following her on discharge.  Please add this to discharge summary if deemed appropriate.  Continue current pain regimen  Addition of miralax for constipation  Regular diet per family request  Noted to have anxiety and sundowning.  D/c vistaril as concern this may worsen sundowning.  Plan for addition of zyprexa nightly.   Goals of Care and Additional Recommendations:  Limitations on Scope of Treatment: Avoid Hospitalization and focus on comfort  Code Status:    Code Status Orders        Start     Ordered   06/02/16 1935  Do not attempt resuscitation (DNR)  Continuous    Question Answer Comment  In the event of cardiac or respiratory ARREST Do not call a "code blue"   In the event of cardiac or respiratory ARREST Do not perform Intubation, CPR, defibrillation or ACLS   In the event of cardiac or respiratory ARREST Use medication by any route, position, wound care, and other measures to relive pain and suffering. May use oxygen, suction and manual treatment of airway obstruction as needed for comfort.      06/02/16 1934    Code Status History    Date Active Date Inactive Code Status Order ID Comments User Context   05/31/2016 11:25 PM 06/02/2016  7:34 PM Full Code [id number]  Reubin Milan, MD Inpatient    Advance Directive Documentation     Most Recent Value  Type of Advance Directive  Healthcare Power of Attorney  Pre-existing out of facility DNR order (yellow form or pink MOST form)  -  "MOST" Form in Place?  -       Prognosis:   < 6 months most  likely.  She has multiple myeloma with significant decline in functional status over the last 2 months.  Discharge Planning:  [place] for rehab with Palliative care service follow-up  Care plan was discussed with HCPOA  Thank you for allowing the Palliative Medicine Team to assist in the care of this patient.   Time In: 1105 Time Out: 1220 Total Time 75 Prolonged Time Billed Yes      Greater than 50%  of this time was spent counseling and coordinating care related to the above assessment and plan.  Micheline Rough, MD  Please contact Palliative Medicine Team phone at [phone] for questions and concerns.

## 2016-06-03 NOTE — Clinical Social Work Note (Signed)
Clinical Social Work Assessment  Patient Details  Name: Andrea Burgess MRN: [id number] Date of Birth: [birth year]  Date of referral:  06/03/16               Reason for consult:  Facility Placement, Discharge Planning                Permission sought to share information with:  Chartered certified accountant granted to share information::  Yes, Verbal Permission Granted  Name::        Agency::     Relationship::     Contact Information:     Housing/Transportation Living arrangements for the past 2 months:  Single Family Home Source of Information:  Adult Children Patient Interpreter Needed:  None Criminal Activity/Legal Involvement Pertinent to Current Situation/Hospitalization:  No - Comment as needed Significant Relationships:  Adult Children Lives with:  Adult Children Do you feel safe going back to the place where you live?  No (SNF needed.) Need for family participation in patient care:  Yes (Comment)  Care giving concerns:  Pt's care cannot be managed at home following hospital d/c.   Social Worker assessment / plan:  Pt hospitalized form home on 05/31/16 with intractable back pain. Pt has dementia and multiple myeloma. Pt is unable to participate in dc planning. CSW met with pt's daughter, Andrea Burgess [phone], to assist with d/c planning. Family is no longer able to provide care and SNF placement is needed. Pt will have radiation for pain management. CSW has initiated SNF search and bed offers are pending. CSW will continue to follow to assist with d/c planning needs.  Employment status:  Retired Forensic scientist:  Medicare PT Recommendations:  [place] / Referral to community resources:  [place]  Patient/Family's Response to care:  Family feels SNF placement is needed at this time.  Patient/Family's Understanding of and Emotional Response to Diagnosis, Current Treatment, and Prognosis:  Palliative Care Team has  been assisting with Andrea Burgess. Family is aware of medical status and appreciates support provided by Palliative Care.   Emotional Assessment Appearance:  Appears stated age Attitude/Demeanor/Rapport:  Unable to Assess Affect (typically observed):  Unable to Assess Orientation:  Oriented to Self Alcohol / Substance use:  Not Applicable Psych involvement (Current and /or in the community):  No (Comment)  Discharge Needs  Concerns to be addressed:  Discharge Planning Concerns Readmission within the last 30 days:  No Current discharge risk:  None Barriers to Discharge:  No Barriers Identified   Luretha Rued, [place] 06/03/2016, 1:08 PM

## 2016-06-03 NOTE — NC FL2 (Signed)
[place] LEVEL OF CARE SCREENING TOOL     IDENTIFICATION  Patient Name: Andrea Burgess Birthdate: [birth year] Sex: female Admission Date (Current Location): 05/31/2016  [facility] and [place] Number:  Engineer, manufacturing systems and Address:  [facility],  [place] [address] [place]      Provider Number: [id number]  Attending Physician Name and Address:  Elmarie Shiley, MD  Relative Name and Phone Number:       Current Level of Care: Hospital Recommended Level of Care: [place] Prior Approval Number:    Date Approved/Denied:   PASRR Number: [id number]  Discharge Plan: SNF    Current Diagnoses: Patient Active Problem List   Diagnosis Date Noted  . Intractable back pain 05/31/2016  . Multiple myeloma ([place]) 05/31/2016  . Blood in the urine 09/12/2015  . Alzheimer's disease 06/01/2015  . Midline low back pain with left-sided sciatica 06/01/2015  . Subacromial bursitis 06/01/2015  . History of recent steroid use 06/01/2015  . Chronic low back pain 05/13/2015  . Gastro-esophageal reflux disease without esophagitis 05/13/2015  . Lumbosacral spondylosis 05/08/2015  . Abnormality of shape of nail 04/15/2015  . Adjustment insomnia 04/15/2015  . Allergic rhinitis 04/15/2015  . Type 2 diabetes mellitus ([place]) 04/15/2015  . Essential (primary) hypertension 04/15/2015  . Bladder cystocele 04/15/2015  . Hypercholesterolemia 04/15/2015  . Bad memory 04/15/2015  . 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 Vitamin B12 deficiency anemia 04/15/2015  . Memory loss 02/28/2015  . Low serum vitamin B12 02/28/2015  . Trochanteric bursitis of left hip 02/28/2015  . Gait disturbance 02/28/2015  . Snoring 02/28/2015  . Depressed affect 02/28/2015  . Abnormal gait 02/28/2015  . Bursitis, trochanteric 02/28/2015    Orientation RESPIRATION BLADDER Height & Weight     Self  Normal Incontinent Weight: 150 lb (68  kg) Height:  5' 4"  (162.6 cm)  BEHAVIORAL SYMPTOMS/MOOD NEUROLOGICAL BOWEL NUTRITION STATUS  Other (Comment) (no behaviors)   Incontinent Diet  AMBULATORY STATUS COMMUNICATION OF NEEDS Skin   Extensive Assist Verbally Normal                       Personal Care Assistance Level of Assistance  Bathing, Feeding, Dressing Bathing Assistance: Maximum assistance Feeding assistance: Limited assistance Dressing Assistance: Maximum assistance     Functional Limitations Info  Sight, Hearing, Speech Sight Info: Adequate Hearing Info: Impaired Speech Info: Adequate    SPECIAL CARE FACTORS FREQUENCY  PT (By licensed PT), OT (By licensed OT)     PT Frequency: 5x wk OT Frequency: 5x wk            Contractures Contractures Info: Not present    Additional Factors Info  Code Status, Allergies Code Status Info: DNR Allergies Info: Tramadol           Current Medications (06/03/2016):  This is the current hospital active medication list Current Facility-Administered Medications  Medication Dose Route Frequency Provider Last Rate Last Dose  . baclofen (LIORESAL) tablet 10 mg  10 mg Oral BID Reubin Milan, MD   10 mg at 06/03/16 0910  . dexamethasone (DECADRON) 1 MG/ML solution 4 mg  4 mg Oral Q12H Belkys A Regalado, MD   4 mg at 06/03/16 0912  . donepezil (ARICEPT) tablet 10 mg  10 mg Oral Q breakfast Reubin Milan, MD   10 mg at 06/03/16 0756  . enoxaparin (LOVENOX) injection 40 mg  40 mg Subcutaneous QHS Reubin Milan,  MD   40 mg at 06/02/16 2142  . gabapentin (NEURONTIN) capsule 300 mg  300 mg Oral QID Reubin Milan, MD   300 mg at 06/03/16 3462  . hydrOXYzine (ATARAX/VISTARIL) tablet 12.5 mg  12.5 mg Oral BID Reubin Milan, MD   12.5 mg at 06/03/16 1947  . lip balm (CARMEX) ointment   Topical PRN Reubin Milan, MD      . loratadine [facility]) tablet 10 mg  10 mg Oral Daily Reubin Milan, MD   10 mg at 06/03/16 0911  . losartan (COZAAR) tablet  25 mg  25 mg Oral Daily Reubin Milan, MD   25 mg at 06/03/16 0910  . memantine (NAMENDA) tablet 5 mg  5 mg Oral BID Reubin Milan, MD   5 mg at 06/03/16 0910  . morphine 4 MG/ML injection 2 mg  2 mg Intravenous Q2H PRN Belkys A Regalado, MD   2 mg at 06/01/16 1249  . nitrofurantoin (macrocrystal-monohydrate) (MACROBID) capsule 100 mg  100 mg Oral Q12H Reubin Milan, MD   100 mg at 06/03/16 0911  . ondansetron (ZOFRAN) injection 4 mg  4 mg Intravenous Q6H PRN Reubin Milan, MD      . oxyCODONE-acetaminophen (PERCOCET) 7.5-325 MG per tablet 1 tablet  1 tablet Oral Q6H Micheline Rough, MD   1 tablet at 06/03/16 1041  . oxyCODONE-acetaminophen (PERCOCET) 7.5-325 MG per tablet 1 tablet  1 tablet Oral Q3H PRN Micheline Rough, MD      . pantoprazole (PROTONIX) EC tablet 40 mg  40 mg Oral Daily Reubin Milan, MD   40 mg at 06/03/16 0756  . potassium chloride SA (K-DUR,KLOR-CON) CR tablet 20 mEq  20 mEq Oral BID Reubin Milan, MD   20 mEq at 06/03/16 0911  . pravastatin (PRAVACHOL) tablet 40 mg  40 mg Oral Daily Reubin Milan, MD   40 mg at 06/03/16 0911     Discharge Medications: Please see discharge summary for a list of discharge medications.  Relevant Imaging Results:  Relevant Lab Results:   Additional Information SS # [id number]. Pt will have radiation treatment for pain management at [facility] cancer center. Schedule is pending.  Rhaelyn Giron, [place] An, LCSW

## 2016-06-04 ENCOUNTER — Ambulatory Visit
Admit: 2016-06-04 | Discharge: 2016-06-04 | Disposition: A | Discharge status: Home / Self Care | Payer: Medicare Other | Attending: Radiation Oncology | Admitting: Radiation Oncology

## 2016-06-04 DIAGNOSIS — C9000 Multiple myeloma not having achieved remission: Secondary | ICD-10-CM

## 2016-06-04 LAB — GLUCOSE, CAPILLARY
GLUCOSE-CAPILLARY: 78 mg/dL (ref 65–99)
GLUCOSE-CAPILLARY: 93 mg/dL (ref 65–99)
GLUCOSE-CAPILLARY: 113 mg/dL — AB (ref 65–99)
GLUCOSE-CAPILLARY: 119 mg/dL — AB (ref 65–99)

## 2016-06-04 MED ORDER — ACETAMINOPHEN 500 MG PO TABS
500.0000 mg | ORAL_TABLET | Freq: Two times a day (BID) | ORAL | Status: DC
  Administered 2016-06-04 – 2016-06-05 (×2): 500 mg via ORAL
  Filled 2016-06-04 (×2): qty 1

## 2016-06-04 MED ORDER — OXYCODONE HCL 5 MG PO TABS
10.0000 mg | ORAL_TABLET | ORAL | Status: DC | PRN
  Administered 2016-06-04 – 2016-06-05 (×3): 10 mg via ORAL
  Filled 2016-06-04 (×4): qty 2

## 2016-06-04 NOTE — Progress Notes (Signed)
  Radiation Oncology         ([phone]) [phone] ________________________________  Name: Andrea Burgess MRN: [id number]  Date: 06/04/2016  DOB: 12-Jun-1927  Weekly Radiation Therapy Management    ICD-9-CM ICD-10-CM   1. Multiple myeloma not having achieved remission ([place]) 203.00 C90.00      Current Dose: 4 Gy     Planned Dose:  16 Gy  Narrative . . . . . . . . The patient presents for routine under treatment assessment.  Pt has received one fraction to thoracic spine, right hip, and right proximal femur. The patient is without complaint.                                  Set-up films were reviewed.                                 The chart was checked. Physical Findings. . Andrea Burgess with BMI 06/04/2016  Height   Weight   BMI   Systolic 409  Diastolic 74  Pulse 92  Respirations 18   Weight essentially stable.  No significant changes. Impression . . . . . . . The patient is tolerating radiation. Plan . . . . . . . . . . . . Continue treatment as planned. Completion before the weekend.  ________________________________   Blair Promise, PhD, MD  This document serves as a record of services personally performed by Gery Pray, MD. It was created on his behalf by Maryla Morrow, a trained medical scribe. The creation of this record is based on the scribe's personal observations and the provider's statements to them. This document has been checked and approved by the attending provider.

## 2016-06-04 NOTE — Progress Notes (Signed)
  Radiation Oncology         [phone] ________________________________  Name: Andrea Burgess MRN: [id number]  Date: 06/04/2016  DOB: 08-Oct-1927  Simulation Verification Note    ICD-9-CM ICD-10-CM   1. Multiple myeloma not having achieved remission ([place]) 203.00 C90.00     Status: inpatient  NARRATIVE: The patient was brought to the treatment unit and placed in the planned treatment position. The clinical setup was verified. Then port films were obtained and uploaded to the radiation oncology medical record software.  The treatment beams were carefully compared against the planned radiation fields. The position location and shape of the radiation fields was reviewed. They targeted volume of tissue appears to be appropriately covered by the radiation beams. Organs at risk appear to be excluded as planned.  Based on my personal review, I approved the simulation verification. The patient's treatment will proceed as planned.  -----------------------------------  Blair Promise, PhD, MD  This document serves as a record of services personally performed by Gery Pray, MD. It was created on his behalf by Maryla Morrow, a trained medical scribe. The creation of this record is based on the scribe's personal observations and the provider's statements to them. This document has been checked and approved by the attending provider.

## 2016-06-04 NOTE — Progress Notes (Addendum)
Patient does not have any questions or concerns today regarding her radiation treatment. She was accompanied by a Presenter, broadcasting.

## 2016-06-04 NOTE — Progress Notes (Signed)
Daily Progress Note   Patient Name: Andrea Burgess       Date: 06/04/2016 DOB: 1927-11-08  Age: 83 y.o. MRN#: [id number] Attending Physician: Elmarie Shiley, MD Primary Care Physician: Charolette Forward, PA-C Admit Date: 05/31/2016  Reason for Consultation/Follow-up: Establishing goals of care  Subjective: Patient resting in chair, her daughter in law/HCPOA, [name] is at the bedside.  Patient not sleeping well, pain in her back is still an issue.    See plan below.  Length of Stay: 3  Current Medications: Scheduled Meds:  . acetaminophen  500 mg Oral Q12H  . amLODipine  5 mg Oral Daily  . baclofen  10 mg Oral BID  . dexamethasone  4 mg Oral Q12H  . donepezil  10 mg Oral Q breakfast  . enoxaparin (LOVENOX) injection  40 mg Subcutaneous QHS  . gabapentin  300 mg Oral QID  . loratadine  10 mg Oral Daily  . losartan  25 mg Oral Daily  . memantine  5 mg Oral BID  . nitrofurantoin (macrocrystal-monohydrate)  100 mg Oral Q12H  . OLANZapine zydis  5 mg Oral QHS  . pantoprazole  40 mg Oral Daily  . polyethylene glycol  17 g Oral Daily  . potassium chloride SA  20 mEq Oral BID  . pravastatin  40 mg Oral Daily    Continuous Infusions:   PRN Meds: lip balm, morphine injection, ondansetron (ZOFRAN) IV, oxyCODONE  Physical Exam   General: Alert, awake, in no acute distress.  HEENT: No bruits, no goiter, no JVD Heart: Regular rate and rhythm. No murmur appreciated. Lungs: Good air movement, clear Abdomen: Soft, nontender, nondistended, positive bowel sounds.  Ext: No significant edema Skin: Warm and dry Neuro: Grossly intact, nonfocal. Psych: Pleasantly confused        Vital Signs: BP (!) 176/74 (BP Location: Right Leg)   Pulse 92   Temp 98 F (36.7 C) (Oral)    Resp 18   Ht _0  (1.626 m)   Wt 68 kg (150 lb)   SpO2 95%   BMI 25.75 kg/m  SpO2: SpO2: 95 % O2 Device: O2 Device: Not Delivered O2 Flow Rate:    Intake/output summary:   Intake/Output Summary (Last 24 hours) at 06/04/16 1320 Last data filed at 06/04/16 1000  Gross per 24 hour  Intake              240 ml  Output              750 ml  Net             -510 ml   LBM: Last BM Date: 05/31/16 Baseline Weight: Weight: 68 kg (150 lb) Most recent weight: Weight: 68 kg (150 lb)       Palliative Assessment/Data:    Flowsheet Rows     Most Recent Value  Intake Tab  Referral Department  Oncology  Unit at Time of Referral  Orthopedic Unit  Palliative Care Primary Diagnosis  Cardiac  Date Notified  06/02/16  Palliative Care Type  New Palliative care  Reason for referral  Clarify Goals of Care, Counsel Regarding Hospice  Date of Admission  05/31/16  Date first seen by Palliative Care  06/02/16  # of days Palliative referral response time  0 Day(s)  # of days IP prior to Palliative referral  2  Clinical Assessment  Palliative Performance Scale Score  40%  Pain Max last 24 hours  Not able to report  Pain Min Last 24 hours  Not able to report  Psychosocial & Spiritual Assessment  Palliative Care Outcomes  Patient/Family meeting held?  Yes  Who was at the meeting?  Daughter in law/HCPOA  Palliative Care Outcomes  Improved pain interventions, Clarified goals of care, Counseled regarding hospice      Patient Active Problem List   Diagnosis Date Noted  . Intractable back pain 05/31/2016  . Multiple myeloma ([place]) 05/31/2016  . Blood in the urine 09/12/2015  . Alzheimer's disease 06/01/2015  . Midline low back pain with left-sided sciatica 06/01/2015  . Subacromial bursitis 06/01/2015  . History of recent steroid use 06/01/2015  . Chronic low back pain 05/13/2015  . Gastro-esophageal reflux disease without esophagitis 05/13/2015  . Lumbosacral spondylosis 05/08/2015  .  Abnormality of shape of nail 04/15/2015  . Adjustment insomnia 04/15/2015  . Allergic rhinitis 04/15/2015  . Type 2 diabetes mellitus ([place]) 04/15/2015  . Essential (primary) hypertension 04/15/2015  . Bladder cystocele 04/15/2015  . Hypercholesterolemia 04/15/2015  . Bad memory 04/15/2015  . 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 Vitamin B12 deficiency anemia 04/15/2015  . Memory loss 02/28/2015  . Low serum vitamin B12 02/28/2015  . Trochanteric bursitis of left hip 02/28/2015  . Gait disturbance 02/28/2015  . Snoring 02/28/2015  . Depressed affect 02/28/2015  . Abnormal gait 02/28/2015  . Bursitis, trochanteric 02/28/2015    Palliative Care Assessment & Plan   Patient Profile: 83 y.o. female  with past medical history of DM, dementia, chronic hematuria, GERD, HTN, HLD, depression, OA, chronic low back pain, and recent diagnosis multiple myeloma admitted on 05/31/2016 with worsened back pain related to metastatic disease.  Palliative consulted for goals of care.  Recommendations/Plan:  D/C percocet, start Oxy IR PO PRN  Add tylenol scheduled.   Agree with radiation.   Continue current dose of Zyprexa  Continue Miralax  To go to SNF [facility] tomorrow.    Goals of Care and Additional Recommendations:  Limitations on Scope of Treatment: Avoid Hospitalization and focus on comfort  Code Status:    Code Status Orders        Start     Ordered   06/02/16 1935  Do not attempt resuscitation (DNR)  Continuous    Question Answer Comment  In the event of cardiac or respiratory ARREST Do not call  a "code blue"   In the event of cardiac or respiratory ARREST Do not perform Intubation, CPR, defibrillation or ACLS   In the event of cardiac or respiratory ARREST Use medication by any route, position, wound care, and other measures to relive pain and suffering. May use oxygen, suction and manual treatment of airway obstruction as needed for comfort.        06/02/16 1934    Code Status History    Date Active Date Inactive Code Status Order ID Comments User Context   05/31/2016 11:25 PM 06/02/2016  7:34 PM Full Code [id number]  Reubin Milan, MD Inpatient    Advance Directive Documentation     Most Recent Value  Type of Advance Directive  Healthcare Power of Attorney  Pre-existing out of facility DNR order (yellow form or pink MOST form)  -  "MOST" Form in Place?  -       Prognosis:   < 6 months most likely.  She has multiple myeloma with significant decline in functional status over the last 2 months.  Discharge Planning:  [place] for rehab with Palliative care service follow-up  Care plan was discussed with HCPOA  Thank you for allowing the Palliative Medicine Team to assist in the care of this patient.   Time In: 12.55 Time Out: 1320 Total Time 25 Prolonged Time Billed No       Greater than 50%  of this time was spent counseling and coordinating care related to the above assessment and plan.  Loistine Chance, MD [phone]  Please contact Palliative Medicine Team phone at [phone] for questions and concerns.
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
## 2016-06-05 ENCOUNTER — Ambulatory Visit
Admit: 2016-06-05 | Discharge: 2016-06-05 | Disposition: A | Discharge status: Home / Self Care | Payer: Medicare Other | Attending: Radiation Oncology | Admitting: Radiation Oncology

## 2016-06-05 DIAGNOSIS — Z510 Encounter for antineoplastic radiation therapy: Secondary | ICD-10-CM | POA: Diagnosis not present

## 2016-06-05 DIAGNOSIS — M549 Dorsalgia, unspecified: Secondary | ICD-10-CM

## 2016-06-05 DIAGNOSIS — G301 Alzheimer's disease with late onset: Secondary | ICD-10-CM

## 2016-06-05 DIAGNOSIS — N390 Urinary tract infection, site not specified: Secondary | ICD-10-CM

## 2016-06-05 DIAGNOSIS — M25559 Pain in unspecified hip: Secondary | ICD-10-CM

## 2016-06-05 DIAGNOSIS — F0280 Dementia in other diseases classified elsewhere without behavioral disturbance: Secondary | ICD-10-CM

## 2016-06-05 DIAGNOSIS — I10 Essential (primary) hypertension: Secondary | ICD-10-CM

## 2016-06-05 LAB — GLUCOSE, CAPILLARY
Glucose-Capillary: 113 mg/dL — ABNORMAL HIGH (ref 65–99)
Glucose-Capillary: 150 mg/dL — ABNORMAL HIGH (ref 65–99)

## 2016-06-05 MED ORDER — POTASSIUM CHLORIDE CRYS ER 20 MEQ PO TBCR: 20.0000 meq | EXTENDED_RELEASE_TABLET | Freq: Every day | ORAL | Status: AC

## 2016-06-05 MED ORDER — ACETAMINOPHEN 500 MG PO TABS: 500.0000 mg | ORAL_TABLET | Freq: Three times a day (TID) | ORAL | Status: AC

## 2016-06-05 MED ORDER — HYDROXYZINE HCL 25 MG PO TABS: 12.5000 mg | ORAL_TABLET | Freq: Two times a day (BID) | ORAL | 0 refills | Status: AC | PRN

## 2016-06-05 MED ORDER — NITROFURANTOIN MONOHYD MACRO 100 MG PO CAPS: 100.0000 mg | ORAL_CAPSULE | Freq: Two times a day (BID) | ORAL | Status: AC

## 2016-06-05 MED ORDER — POLYETHYLENE GLYCOL 3350 17 G PO PACK: 17.0000 g | PACK | Freq: Every day | ORAL | Status: AC

## 2016-06-05 MED ORDER — OXYCODONE HCL 10 MG PO TABS: 10.0000 mg | ORAL_TABLET | ORAL | 0 refills | Status: DC | PRN

## 2016-06-05 MED ORDER — OLANZAPINE 5 MG PO TBDP: 5.0000 mg | ORAL_TABLET | Freq: Every day | ORAL | Status: AC

## 2016-06-05 MED ORDER — AMLODIPINE BESYLATE 5 MG PO TABS: 5.0000 mg | ORAL_TABLET | Freq: Every day | ORAL | Status: AC

## 2016-06-05 MED ORDER — DEXAMETHASONE 1 MG/ML PO CONC: 4.0000 mg | Freq: Two times a day (BID) | ORAL | Status: AC

## 2016-06-05 NOTE — Clinical Social Work Placement (Signed)
   CLINICAL SOCIAL WORK PLACEMENT  NOTE  Date:  06/05/2016  Patient Details  Name: Andrea Burgess MRN: [id number] Date of Birth: [birth year]  Clinical Social Work is seeking post-discharge placement for this patient at the [place] level of care (*CSW will initial, date and re-position this form in  chart as items are completed):  Yes   Patient/family provided with [facility] Work Department's list of facilities offering this level of care within the geographic area requested by the patient (or if unable, by the patient's family).  Yes   Patient/family informed of their freedom to choose among providers that offer the needed level of care, that participate in Medicare, Medicaid or managed care program needed by the patient, have an available bed and are willing to accept the patient.  Yes   Patient/family informed of [facility]'s ownership interest in [facility] and [facility], as well as of the fact that they are under no obligation to receive care at these facilities.  PASRR submitted to EDS on 06/03/16     PASRR number received on 06/03/16     Existing PASRR number confirmed on       FL2 transmitted to all facilities in geographic area requested by pt/family on 06/03/16     FL2 transmitted to all facilities within larger geographic area on       Patient informed that his/her managed care company has contracts with or will negotiate with certain facilities, including the following:        Yes   Patient/family informed of bed offers received.  Patient chooses bed at [facility]     Physician recommends and patient chooses bed at      Patient to be transferred to [facility] on 06/05/16.  Patient to be transferred to facility by [facility]     Patient family notified on 06/05/16 of transfer.  Name of family member notified:  daughter in-law     PHYSICIAN       Additional Comment: Pt / family are in  agreement with d/c to [facility] today. [facility] transport is needed. Medical necessity form completed. Daughter in-law is aware out of pocket costs may be associated with [facility] transport. D/C Summary sent to SNF for review. Scripts included in d/c packet. # for report provided to nsg.   _______________________________________________ Luretha Rued, [place]  [phone] 06/05/2016, 4:27 PM

## 2016-06-05 NOTE — Progress Notes (Signed)
CSW assisting with d/c planning. Pt has a SNF bed at [facility] today if stable for d/c. CSW will assist with d/c planning to SNF.  Werner Lean LCSW [phone]

## 2016-06-05 NOTE — Care Management Important Message (Signed)
Important Message  Patient Details  Name: Andrea Burgess MRN: [id number] Date of Birth: [birth year]   Medicare Important Message Given:  Yes    Kerin Salen 06/05/2016, 12:45 [place] Message  Patient Details  Name: Andrea Burgess MRN: [id number] Date of Birth: 1927/05/22   Medicare Important Message Given:  Yes    Kerin Salen 06/05/2016, 12:45 PM

## 2016-06-05 NOTE — Progress Notes (Signed)
Daily Progress Note   Patient Name: Andrea Burgess       Date: 06/05/2016 DOB: [birth year]  Age: 83 y.o. MRN#: [id number] Attending Physician: Barton Dubois, MD Primary Care Physician: Charolette Forward, PA-C Admit Date: 05/31/2016  Reason for Consultation/Follow-up: Establishing goals of care  Subjective: Patient resting in bed, her daughter in law/HCPOA, [name] is at the bedside.  Patient slept well, pain better controlled Went for radiation In good spirits Had a bowel movement earlier today   See plan below.  Length of Stay: 4  Current Medications: Scheduled Meds:  . acetaminophen  500 mg Oral Q12H  . amLODipine  5 mg Oral Daily  . baclofen  10 mg Oral BID  . dexamethasone  4 mg Oral Q12H  . donepezil  10 mg Oral Q breakfast  . enoxaparin (LOVENOX) injection  40 mg Subcutaneous QHS  . gabapentin  300 mg Oral QID  . loratadine  10 mg Oral Daily  . losartan  25 mg Oral Daily  . memantine  5 mg Oral BID  . nitrofurantoin (macrocrystal-monohydrate)  100 mg Oral Q12H  . OLANZapine zydis  5 mg Oral QHS  . pantoprazole  40 mg Oral Daily  . polyethylene glycol  17 g Oral Daily  . potassium chloride SA  20 mEq Oral BID  . pravastatin  40 mg Oral Daily    Continuous Infusions:   PRN Meds: lip balm, morphine injection, ondansetron (ZOFRAN) IV, oxyCODONE  Physical Exam   General: Alert, awake, in no acute distress.  HEENT: No bruits, no goiter, no JVD Heart: Regular rate and rhythm. No murmur appreciated. Lungs: Good air movement, clear Abdomen: Soft, nontender, nondistended, positive bowel sounds.  Ext: No significant edema Skin: Warm and dry Neuro: Grossly intact, nonfocal.     Vital Signs: BP (!) 148/75   Pulse (!) 104   Temp 97.4 F (36.3 C) (Oral)   Resp  17   Ht 5' 4"  (1.626 m)   Wt 68 kg (150 lb)   SpO2 98%   BMI 25.75 kg/m  SpO2: SpO2: 98 % O2 Device: O2 Device: Not Delivered O2 Flow Rate:    Intake/output summary:   Intake/Output Summary (Last 24 hours) at 06/05/16 1227 Last data filed at 06/05/16 0937  Gross per 24 hour  Intake  900 ml  Output              400 ml  Net              500 ml   LBM: Last BM Date: 05/31/16 Baseline Weight: Weight: 68 kg (150 lb) Most recent weight: Weight: 68 kg (150 lb)       Palliative Assessment/Data:    Flowsheet Rows     Most Recent Value  Intake Tab  Referral Department  Oncology  Unit at Time of Referral  Orthopedic Unit  Palliative Care Primary Diagnosis  Cardiac  Date Notified  06/02/16  Palliative Care Type  New Palliative care  Reason for referral  Clarify Goals of Care, Counsel Regarding Hospice  Date of Admission  05/31/16  Date first seen by Palliative Care  06/02/16  # of days Palliative referral response time  0 Day(s)  # of days IP prior to Palliative referral  2  Clinical Assessment  Palliative Performance Scale Score  40%  Pain Max last 24 hours  Not able to report  Pain Min Last 24 hours  Not able to report  Psychosocial & Spiritual Assessment  Palliative Care Outcomes  Patient/Family meeting held?  Yes  Who was at the meeting?  Daughter in law/HCPOA  Palliative Care Outcomes  Improved pain interventions, Clarified goals of care, Counseled regarding hospice      Patient Active Problem List   Diagnosis Date Noted  . Intractable back pain 05/31/2016  . Multiple myeloma ([place]) 05/31/2016  . Blood in the urine 09/12/2015  . Alzheimer's disease 06/01/2015  . Midline low back pain with left-sided sciatica 06/01/2015  . Subacromial bursitis 06/01/2015  . History of recent steroid use 06/01/2015  . Chronic low back pain 05/13/2015  . Gastro-esophageal reflux disease without esophagitis 05/13/2015  . Lumbosacral spondylosis 05/08/2015  . Abnormality  of shape of nail 04/15/2015  . Adjustment insomnia 04/15/2015  . Allergic rhinitis 04/15/2015  . Type 2 diabetes mellitus ([place]) 04/15/2015  . Essential (primary) hypertension 04/15/2015  . Bladder cystocele 04/15/2015  . Hypercholesterolemia 04/15/2015  . Bad memory 04/15/2015  . 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 Vitamin B12 deficiency anemia 04/15/2015  . Memory loss 02/28/2015  . Low serum vitamin B12 02/28/2015  . Trochanteric bursitis of left hip 02/28/2015  . Gait disturbance 02/28/2015  . Snoring 02/28/2015  . Depressed affect 02/28/2015  . Abnormal gait 02/28/2015  . Bursitis, trochanteric 02/28/2015    Palliative Care Assessment & Plan   Patient Profile: 83 y.o. female  with past medical history of DM, dementia, chronic hematuria, GERD, HTN, HLD, depression, OA, chronic low back pain, and recent diagnosis multiple myeloma admitted on 05/31/2016 with worsened back pain related to metastatic disease.  Palliative consulted for goals of care.  Recommendations/Plan:    Oxy IR PO PRN    tylenol scheduled.   Agree with radiation.   Continue current dose of Zyprexa  Continue Miralax  To go to SNF [facility] soon    Goals of Care and Additional Recommendations:  Limitations on Scope of Treatment: Avoid Hospitalization and focus on comfort  Code Status:    Code Status Orders        Start     Ordered   06/02/16 1935  Do not attempt resuscitation (DNR)  Continuous    Question Answer Comment  In the event of cardiac or respiratory ARREST Do not call a "code blue"   In the event of cardiac or respiratory ARREST  Do not perform Intubation, CPR, defibrillation or ACLS   In the event of cardiac or respiratory ARREST Use medication by any route, position, wound care, and other measures to relive pain and suffering. May use oxygen, suction and manual treatment of airway obstruction as needed for comfort.      06/02/16 1934    Code Status  History    Date Active Date Inactive Code Status Order ID Comments User Context   05/31/2016 11:25 PM 06/02/2016  7:34 PM Full Code [id number]  Reubin Milan, MD Inpatient    Advance Directive Documentation     Most Recent Value  Type of Advance Directive  Healthcare Power of Attorney  Pre-existing out of facility DNR order (yellow form or pink MOST form)  -  "MOST" Form in Place?  -       Prognosis:   < 6 months most likely.  She has multiple myeloma with significant decline in functional status over the last 2 months.  Discharge Planning:  [place] for rehab with Palliative care service follow-up  Care plan was discussed with HCPOA  Thank you for allowing the Palliative Medicine Team to assist in the care of this patient.   Time In: 12 Time Out: 12.25 Total Time 25 Prolonged Time Billed No       Greater than 50%  of this time was spent counseling and coordinating care related to the above assessment and plan.  Loistine Chance, MD [phone]  Please contact Palliative Medicine Team phone at [phone] for questions and concerns.

## 2016-06-05 NOTE — Progress Notes (Signed)
Patient refused CBG due to discharge.
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
## 2016-06-06 ENCOUNTER — Ambulatory Visit
Admission: RE | Admit: 2016-06-06 | Discharge: 2016-06-06 | Disposition: A | Discharge status: Home / Self Care | Payer: Medicare Other | Source: Ambulatory Visit | Attending: Radiation Oncology | Admitting: Radiation Oncology

## 2016-06-06 ENCOUNTER — Telehealth: Payer: Self-pay | Admitting: Oncology

## 2016-06-06 ENCOUNTER — Ambulatory Visit: Payer: Medicare Other | Admitting: Oncology

## 2016-06-06 DIAGNOSIS — Z510 Encounter for antineoplastic radiation therapy: Secondary | ICD-10-CM | POA: Diagnosis present

## 2016-06-06 DIAGNOSIS — C9000 Multiple myeloma not having achieved remission: Secondary | ICD-10-CM | POA: Diagnosis present

## 2016-06-06 NOTE — Telephone Encounter (Signed)
Called [facility] and spoke to Dudley, patient's nurse.  She said she was not aware of Aleisha's radiation appointments for today and tomorrow and will check on getting her transportation for today.  She will call back.

## 2016-06-06 NOTE — Telephone Encounter (Signed)
Called [facility] back.  They said patient would be here at 12.  Notified [facility] 3.

## 2016-06-07 ENCOUNTER — Ambulatory Visit: Payer: Medicare Other

## 2016-06-07 ENCOUNTER — Ambulatory Visit
Admission: RE | Admit: 2016-06-07 | Discharge: 2016-06-07 | Disposition: A | Discharge status: Home / Self Care | Payer: Medicare Other | Source: Ambulatory Visit | Attending: Radiation Oncology | Admitting: Radiation Oncology

## 2016-06-07 DIAGNOSIS — Z510 Encounter for antineoplastic radiation therapy: Secondary | ICD-10-CM | POA: Diagnosis not present

## 2016-06-10 ENCOUNTER — Ambulatory Visit: Payer: Medicare Other

## 2016-06-11 ENCOUNTER — Ambulatory Visit: Payer: Medicare Other

## 2016-06-12 ENCOUNTER — Ambulatory Visit: Payer: Medicare Other

## 2016-06-13 ENCOUNTER — Ambulatory Visit: Payer: Medicare Other

## 2016-06-14 ENCOUNTER — Ambulatory Visit: Payer: Medicare Other

## 2016-06-17 ENCOUNTER — Ambulatory Visit: Payer: Medicare Other

## 2016-06-20 ENCOUNTER — Encounter: Payer: Self-pay | Admitting: Radiation Oncology

## 2016-06-20 NOTE — Progress Notes (Signed)
  Radiation Oncology         [phone] ________________________________  Name: Andrea Burgess MRN: [id number]  Date: 06/20/2016  DOB: March 05, 1928  End of Treatment Note  Diagnosis:   Multiple myeloma not having achieved remission     Indication for treatment:  Palliative       Radiation treatment dates:   06/04/16 - 06/07/16  Site/dose:   T-spine treated to 16 Gy with 4 fx of 4 Gy                     Right hip treated to 16 Gy with 4 fx of 4 Gy                     Right femur treated to 16 Gy with 4 fx of 4 Gy  Beams/energy:   T spine: Isodose Plan // 10X, 15X        Right hip: Isodose Plan // 10X                   Right femur: Isodose Plan // 10X  Narrative: The patient tolerated radiation treatment relatively well.  Her pain improved during the course of the treatment.  Plan: The patient has completed radiation treatment. The patient will return to radiation oncology clinic for routine followup in one month. I advised them to call or return sooner if they have any questions or concerns related to their recovery or treatment.  -----------------------------------  Blair Promise, PhD, MD  This document serves as a record of services personally performed by Gery Pray, MD. It was created on his behalf by Linward Natal, a trained medical scribe. The creation of this record is based on the scribe's personal observations and the provider's statements to them. This document has been checked and approved by the attending provider.

## 2016-07-13 ENCOUNTER — Emergency Department (HOSPITAL_COMMUNITY)
Admission: EM | Admit: 2016-07-13 | Discharge: 2016-07-14 | Disposition: A | Discharge status: Home / Self Care | Payer: Medicare Other | Attending: Emergency Medicine | Admitting: Emergency Medicine

## 2016-07-13 ENCOUNTER — Encounter (HOSPITAL_COMMUNITY): Payer: Self-pay

## 2016-07-13 DIAGNOSIS — Z859 Personal history of malignant neoplasm, unspecified: Secondary | ICD-10-CM | POA: Diagnosis not present

## 2016-07-13 DIAGNOSIS — I10 Essential (primary) hypertension: Secondary | ICD-10-CM | POA: Diagnosis not present

## 2016-07-13 DIAGNOSIS — Z79899 Other long term (current) drug therapy: Secondary | ICD-10-CM | POA: Insufficient documentation

## 2016-07-13 DIAGNOSIS — Z515 Encounter for palliative care: Secondary | ICD-10-CM | POA: Insufficient documentation

## 2016-07-13 DIAGNOSIS — E119 Type 2 diabetes mellitus without complications: Secondary | ICD-10-CM | POA: Diagnosis not present

## 2016-07-13 DIAGNOSIS — R031 Nonspecific low blood-pressure reading: Secondary | ICD-10-CM | POA: Diagnosis present

## 2016-07-13 DIAGNOSIS — G309 Alzheimer's disease, unspecified: Secondary | ICD-10-CM | POA: Insufficient documentation

## 2016-07-13 HISTORY — DX: Malignant (primary) neoplasm, unspecified: C80.1

## 2016-07-13 MED ORDER — MORPHINE SULFATE 20 MG/5ML PO SOLN: 5.0000 mg | ORAL | 0 refills | Status: AC

## 2016-07-13 MED ORDER — MORPHINE SULFATE 10 MG/5ML PO SOLN: 5.0000 mg | Freq: Once | ORAL | Status: DC

## 2016-07-13 MED ORDER — MORPHINE SULFATE (CONCENTRATE) 10 MG/0.5ML PO SOLN
5.0000 mg | Freq: Once | ORAL | Status: AC
  Administered 2016-07-13: 5 mg via ORAL
  Filled 2016-07-13: qty 0.5

## 2016-07-13 MED ORDER — SODIUM CHLORIDE 0.9 % IV BOLUS (SEPSIS)
1000.0000 mL | Freq: Once | INTRAVENOUS | Status: AC
  Administered 2016-07-13: 1000 mL via INTRAVENOUS

## 2016-07-13 NOTE — Discharge Instructions (Signed)
In accordance with prior plans and wishes, we will proceed with palliative care, at this time.  I am changing the pain medication from oxycodone tablet, to liquid morphine.  Follow through with the consultation, with hospitalist, as planned, on 07/15/16

## 2016-07-13 NOTE — ED Triage Notes (Signed)
Pt presents to the ed with ems from [facility] for chest pain and increased lethargy x 1 week.  The patient is a cancer patient that started in her thoracic wall and has spread.  She also has a hip fracture and is receiving oxycodone for pain around the clock. Pt is alert to voice and minor stimulation.  Confused but does have dementia.  Hypotensive with ems.

## 2016-07-13 NOTE — ED Provider Notes (Signed)
[facility] DEPT Provider Note   CSN: [id number] Arrival date & time: 07/13/16  1826     History   Chief Complaint Chief Complaint  Patient presents with  . Chest Pain  . Fatigue    HPI Andrea Burgess is a 83 y.o. female.  Patient is here for evaluation of low blood pressure.  This was apparently noted during routine evaluation today.  Patient's daughter in law was with her today and feels like the patient is somewhat less alert than usual.  The patient also told her daughter in law that she was having chest pain today, and that she thought she was going to die.  The patient has end-stage multiple myeloma, and is currently receiving "comfort care".  She is also had consultation with palliative care medicine, the decision was made to make the patient DNR, and limit the scope of treatment, and hospitalizations.  The patient was not directly assessed by a physician, prior to transport from her skilled nursing facility today.  The patient has been eating less than usual, for several weeks, and has been taking pain medicines 3 times a day, as needed bone pain.  The patient cannot contribute to history.  Level 5 caveat-altered mental status  HPI  Past Medical History:  Diagnosis Date  . Cancer ([place])   . Diabetes mellitus without complication ([place])   . Hypertension   . Vision abnormalities     Patient Active Problem List   Diagnosis Date Noted  . Hip pain   . Acute lower UTI   . Intractable back pain 05/31/2016  . Multiple myeloma ([place]) 05/31/2016  . Blood in the urine 09/12/2015  . Alzheimer's disease 06/01/2015  . Midline low back pain with left-sided sciatica 06/01/2015  . Subacromial bursitis 06/01/2015  . History of recent steroid use 06/01/2015  . Chronic low back pain 05/13/2015  . Gastro-esophageal reflux disease without esophagitis 05/13/2015  . Lumbosacral spondylosis 05/08/2015  . Abnormality of shape of nail 04/15/2015  . Adjustment insomnia 04/15/2015  .  Allergic rhinitis 04/15/2015  . Type 2 diabetes mellitus ([place]) 04/15/2015  . Essential (primary) hypertension 04/15/2015  . Bladder cystocele 04/15/2015  . Hypercholesterolemia 04/15/2015  . Bad memory 04/15/2015  . 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 Vitamin B12 deficiency anemia 04/15/2015  . Memory loss 02/28/2015  . Low serum vitamin B12 02/28/2015  . Trochanteric bursitis of left hip 02/28/2015  . Gait disturbance 02/28/2015  . Snoring 02/28/2015  . Depressed affect 02/28/2015  . Abnormal gait 02/28/2015  . Bursitis, trochanteric 02/28/2015    Past Surgical History:  Procedure Laterality Date  . BREAST LUMPECTOMY Left   . TONSILLECTOMY    . VESICOVAGINAL FISTULA CLOSURE W/ TAH      OB History    No data available       Home Medications    Prior to Admission medications   Medication Sig Start Date End Date Taking? Authorizing Provider  acetaminophen (TYLENOL) 500 MG tablet Take 1 tablet (500 mg total) by mouth 3 (three) times daily. 06/05/16   Barton Dubois, MD  amLODipine (NORVASC) 5 MG tablet Take 1 tablet (5 mg total) by mouth daily. 06/06/16   Barton Dubois, MD  baclofen (LIORESAL) 10 MG tablet Take 10 mg by mouth 2 (two) times daily.    Historical Provider, MD  Cyanocobalamin (VITAMIN B12 PO) Take 1 tablet by mouth daily.    Historical Provider, MD  dexamethasone (DECADRON) 1 MG/ML solution Take 4 mLs (4 mg total) by mouth every  12 (twelve) hours. 06/05/16   Barton Dubois, MD  donepezil (ARICEPT) 10 MG tablet Take 1 tablet (10 mg total) by mouth at bedtime. Patient taking differently: Take 10 mg by mouth daily with breakfast.  11/13/15   Britt Bottom, MD  gabapentin (NEURONTIN) 300 MG capsule Take 300 mg by mouth 4 (four) times daily.    Historical Provider, MD  hydrOXYzine (ATARAX/VISTARIL) 25 MG tablet Take 0.5 tablets (12.5 mg total) by mouth every 12 (twelve) hours as needed for anxiety or itching (sleep). 06/05/16   Barton Dubois, MD    loratadine (KLS ALLERCLEAR) 10 MG tablet Take 10 mg by mouth daily.    Historical Provider, MD  losartan (COZAAR) 25 MG tablet Take 25 mg by mouth daily.    Historical Provider, MD  memantine (NAMENDA) 5 MG tablet Take 1 tablet (5 mg total) by mouth 2 (two) times daily. 05/16/16   Britt Bottom, MD  OLANZapine zydis (ZYPREXA) 5 MG disintegrating tablet Take 1 tablet (5 mg total) by mouth at bedtime. 06/05/16   Barton Dubois, MD  omeprazole (PRILOSEC) 40 MG capsule Take 40 mg by mouth daily.     Historical Provider, MD  oxyCODONE 10 MG TABS Take 1 tablet (10 mg total) by mouth every 4 (four) hours as needed for moderate pain or severe pain. 06/05/16   Barton Dubois, MD  polyethylene glycol [facility] / GLYCOLAX) packet Take 17 g by mouth daily. 06/06/16   Barton Dubois, MD  potassium chloride SA (K-DUR,KLOR-CON) 20 MEQ tablet Take 1 tablet (20 mEq total) by mouth daily. 06/05/16   Barton Dubois, MD  pravastatin (PRAVACHOL) 40 MG tablet Take 40 mg by mouth daily.    Historical Provider, MD    Family History Family History  Problem Relation Age of Onset  . Healthy Mother     Social History Social History  Substance Use Topics  . Smoking status: Never Smoker  . Smokeless tobacco: Never Used  . Alcohol use No     Allergies   Tramadol   Review of Systems Review of Systems  Unable to perform ROS: Mental status change     Physical Exam Updated Vital Signs BP (!) 79/46   Pulse 74   Temp 99.4 F (37.4 C) (Rectal)   Resp 18   Ht 5' 6"  (1.676 m)   Wt 145 lb (65.8 kg)   SpO2 97%   BMI 23.40 kg/m   Physical Exam  Constitutional: She appears well-developed. She appears distressed (Mildly uncomfortable).  Frail elderly patient.  HENT:  Head: Normocephalic and atraumatic.  Dry oral mucous membranes  Eyes: Conjunctivae and EOM are normal. Pupils are equal, round, and reactive to light.  Neck: Normal range of motion and phonation normal. Neck supple.  Cardiovascular: Normal rate.    Hypotensive  Pulmonary/Chest: Effort normal. She exhibits no tenderness.  Neurological: She exhibits normal muscle tone.  Lethargic, nonverbal.  Skin: Skin is warm and dry.  Psychiatric:  Obtunded  Nursing note and vitals reviewed.    ED Treatments / Results  Labs (all labs ordered are listed, but only abnormal results are displayed) Labs Reviewed - No data to display  EKG  EKG Interpretation  Date/Time:  Saturday July 13 2016 18:36:07 EDT Ventricular Rate:  77 PR Interval:  186 QRS Duration: 72 QT Interval:  384 QTC Calculation: 434 R Axis:   -37 Text Interpretation:  Normal sinus rhythm Left axis deviation Abnormal ECG since last tracing no significant change Confirmed by Eulis Foster  MD,  Liev Brockbank [phone]) on 07/13/2016 8:03:48 PM       Radiology No results found.  Procedures Procedures (including critical care time)  Medications Ordered in ED Medications  morphine 10 MG/5ML solution 5 mg (not administered)  sodium chloride 0.9 % bolus 1,000 mL (1,000 mLs Intravenous New Bag/Given 07/13/16 1954)     Initial Impression / Assessment and Plan / ED Course  I have reviewed the triage vital signs and the nursing notes.  Pertinent labs & imaging results that were available during my care of the patient were reviewed by me and considered in my medical decision making (see chart for details).  Clinical Course as of Jul 13 2045  Sat Jul 13, 2016  2043 Discussion with the patient's son, and her daughter-in-law, who is the patient's power of attorney.  They understand that the patient's condition is near terminal, and want to continue with "comfort care," and the prior plan, by palliative care medicine.  We discussed changing her pain medicine to oral liquid morphine, and give it regularly, without the patient asking for pain medicine.  The family members want to proceed in this fashion.  [EW]    Clinical Course User Index [EW] Daleen Bo, MD    Medications  morphine 10  MG/5ML solution 5 mg (not administered)  sodium chloride 0.9 % bolus 1,000 mL (1,000 mLs Intravenous New Bag/Given 07/13/16 1954)    Patient Vitals for the past 24 hrs:  BP Temp Temp src Pulse Resp SpO2 Height Weight  07/13/16 1930 (!) 79/46 - - 74 - 97 % - -  07/13/16 1900 (!) 83/56 - - 75 - 97 % - -  07/13/16 1851 - 99.4 F (37.4 C) Rectal - - - - -  07/13/16 1835 - 97.4 F (36.3 C) - - - - - -  07/13/16 1829 (!) 76/46 - - 82 18 96 % - -  07/13/16 1828 - - - - - - 5' 6"  (1.676 m) 145 lb (65.8 kg)    8:44 PM Reevaluation with update and discussion. After initial assessment and treatment, an updated evaluation reveals no change in clinical status.  Family members updated on findings, plan and all questions were answered. Norinne Jeane L    Final Clinical Impressions(s) / ED Diagnoses   Final diagnoses:  Palliative care encounter    End of life, terminal multiple myeloma, with decreased oral intake, and hypotension.  Ongoing chronic pain, secondary to multiple myeloma.  Patient is palliative care status, and has become unable to request pain medicine, on her own.  Patient is being changed from oxycodone tablets, to liquid morphine.  Providers at her SNF, informed and hopefully will carry on with this treatment plan.   Nursing Notes Reviewed/ Care Coordinated Applicable Imaging Reviewed Interpretation of Laboratory Data incorporated into ED treatment  The patient appears reasonably screened and/or stabilized for discharge and I doubt any other medical condition or other [facility] requiring further screening, evaluation, or treatment in the ED at this time prior to discharge.  Plan: Home Medications-change oxycodone, to morphine liquid; Home Treatments-palliative care; return here if the recommended treatment, does not improve the symptoms; Recommended follow up-PCP as planned and as needed.  Follow-up with hospice, in 2 days as scheduled.   New Prescriptions New Prescriptions   No  medications on file     Daleen Bo, MD 07/13/16 [phone]

## 2016-07-13 NOTE — ED Notes (Signed)
EDP @ bedside 

## 2016-07-25 ENCOUNTER — Encounter: Payer: Self-pay | Admitting: Neurology

## 2016-08-16 DEATH — deceased

## 2016-10-08 ENCOUNTER — Ambulatory Visit: Payer: Medicare Other | Admitting: Neurology

## 2019-01-24 IMAGING — MR MR THORACIC SPINE WO/W CM
7 of 17 series · 19 of 48 positions shown · IV contrast (multihance)
Comparison: Radiography 05/31/2016. MRI 10/13/2015. CT chest
05/14/2016.

CLINICAL DATA: Dementia. Back pain. Recent diagnosis of multiple
myeloma with progressive back pain symptoms and difficulty with
urination.

EXAM:
MRI THORACIC AND LUMBAR SPINE WITHOUT AND WITH CONTRAST
TECHNIQUE: Multiplanar and multiecho pulse sequences of the thoracic and lumbar
spine were obtained without and with intravenous contrast.
CONTRAST:  13mL MULTIHANCE GADOBENATE DIMEGLUMINE 529 MG/ML IV SOLN

[Series 5: T1 · sagittal · 4.0mm · 1.25mm/px · 1 of 14 slices shown (1 of 3)]
[im 1/14]
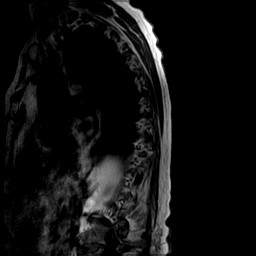

[Series 7: T2 post-contrast · sagittal · 4.0mm · 1.25mm/px · 1 of 14 slices shown (1 of 2)]
[im 1/14]
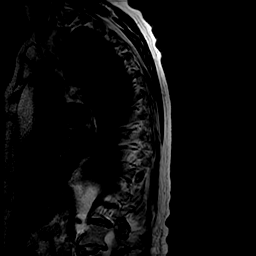

[Series 8: T2 · axial · 5.0mm · 0.39mm/px · z∈[-191,+32]mm · 5 of 50 slices shown (1 of 2)]
[im 1/50]
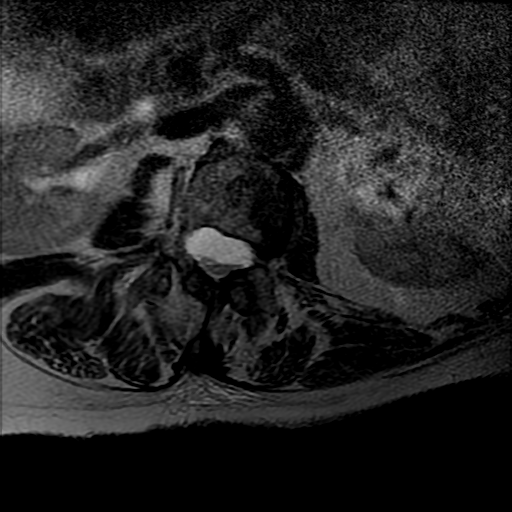
[im 13/50]
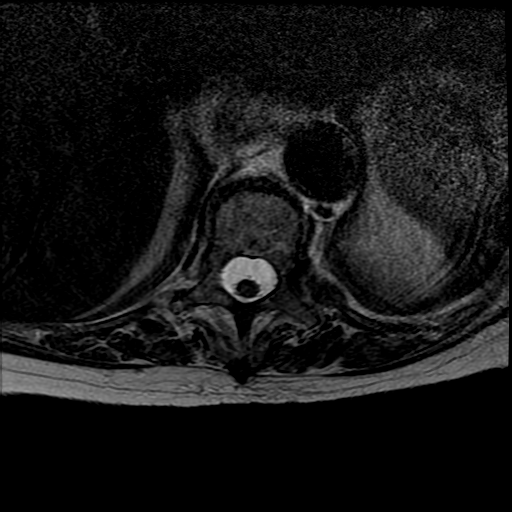
[im 25/50]
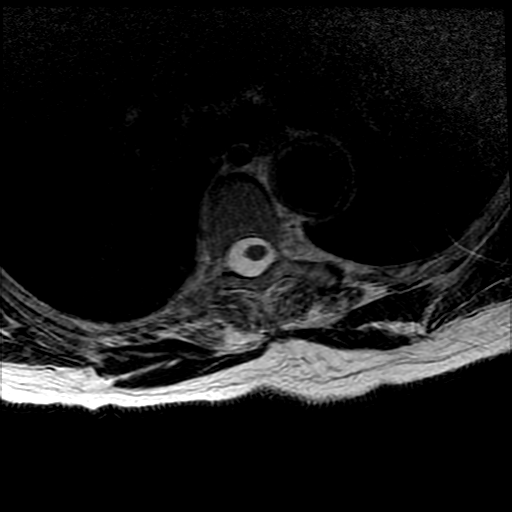
[im 37/50]
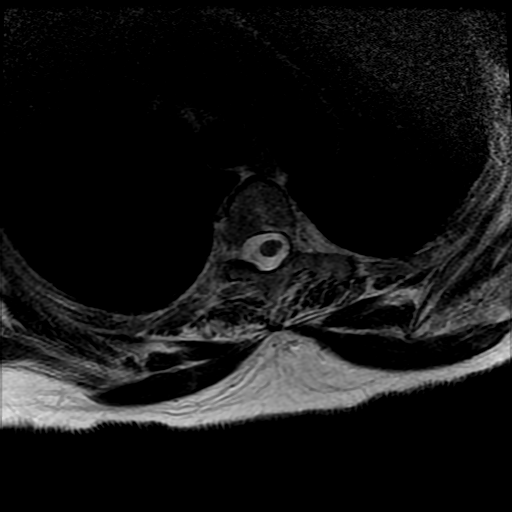
[im 50/50]
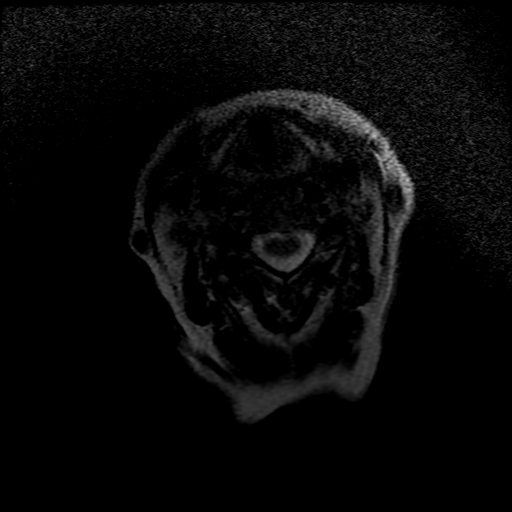

[Series 9: T1 · axial · non-contrast · 5.0mm · 0.39mm/px · z∈[-186,+27]mm · 5 of 48 slices shown (2 of 3)]
[im 1/48]
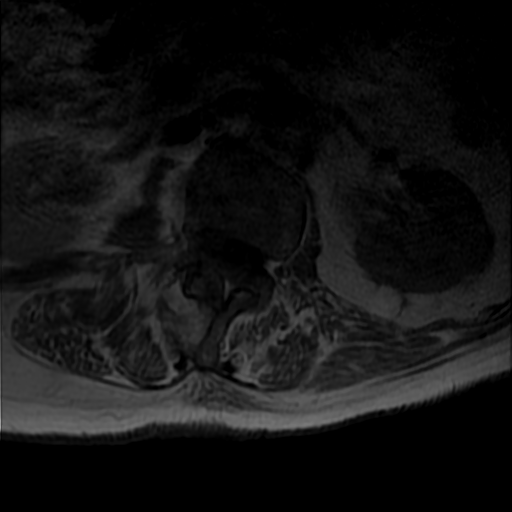
[im 12/48]
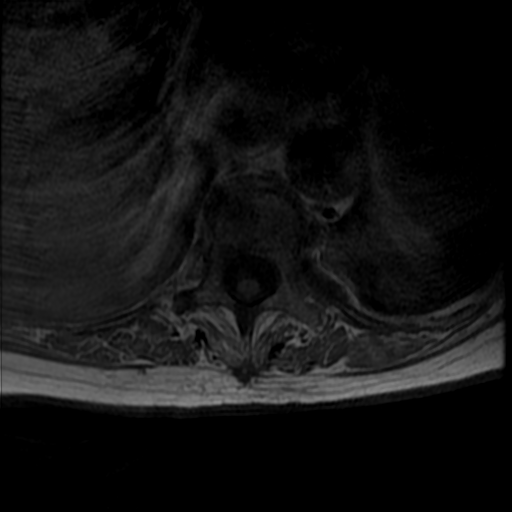
[im 24/48]
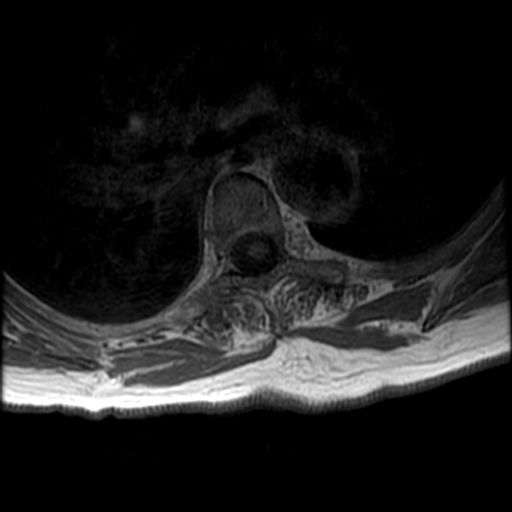
[im 36/48]
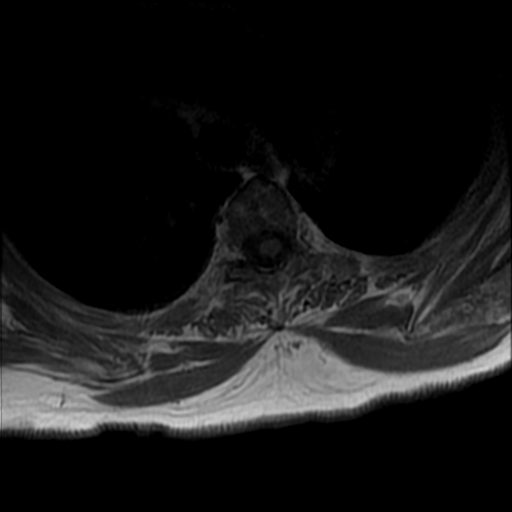
[im 48/48]
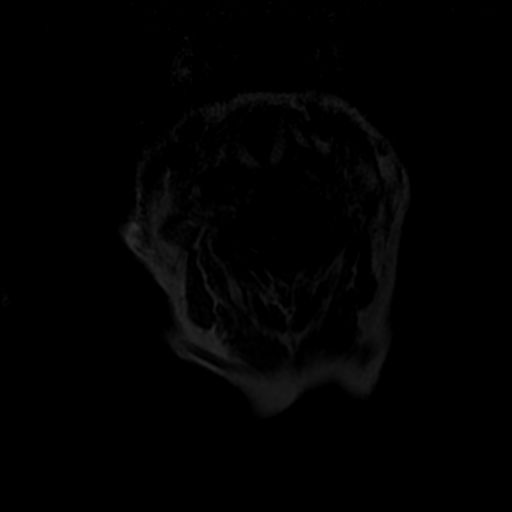

[Series 13: T1 · sagittal · 5.0mm · 0.51mm/px · 1 of 16 slices shown (3 of 3)]
[im 1/16]
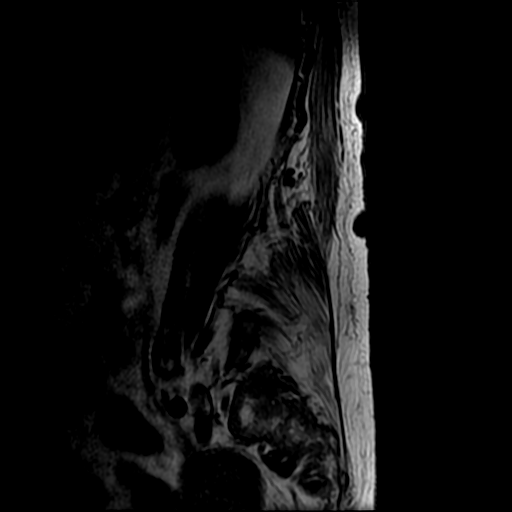

[Series 14: T2 post-contrast · sagittal · 5.0mm · 0.51mm/px · 2 of 16 slices shown (2 of 2)]
[im 1/16]
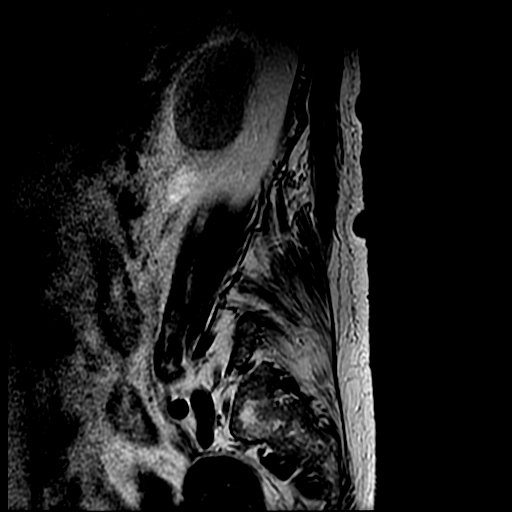
[im 16/16]
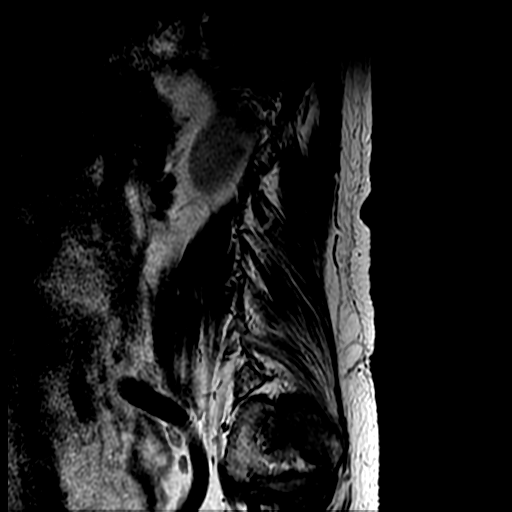

[Series 17: T2 · axial · 5.0mm · 0.39mm/px · z∈[-385,-167]mm · 4 of 34 slices shown (2 of 2)]
[im 1/34]
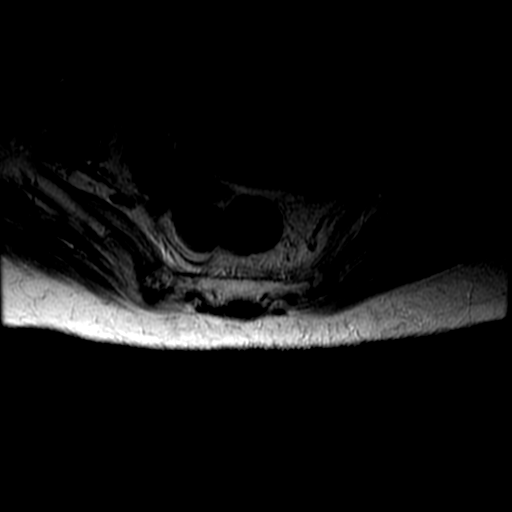
[im 12/34]
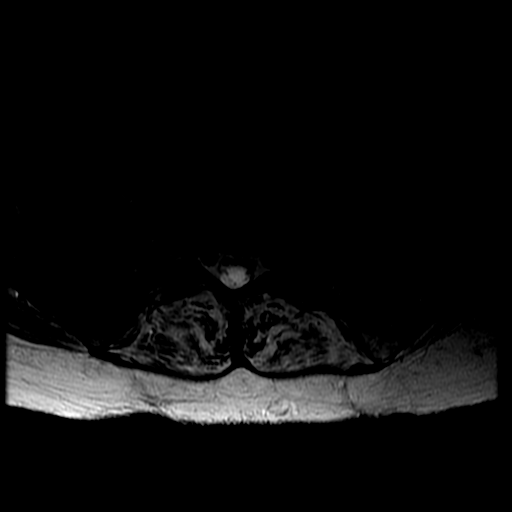
[im 23/34]
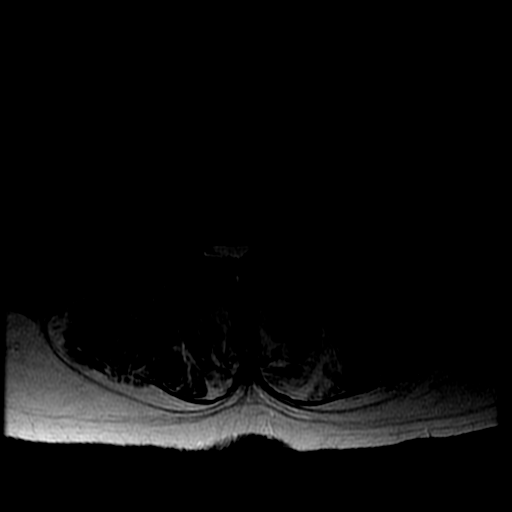
[im 34/34]
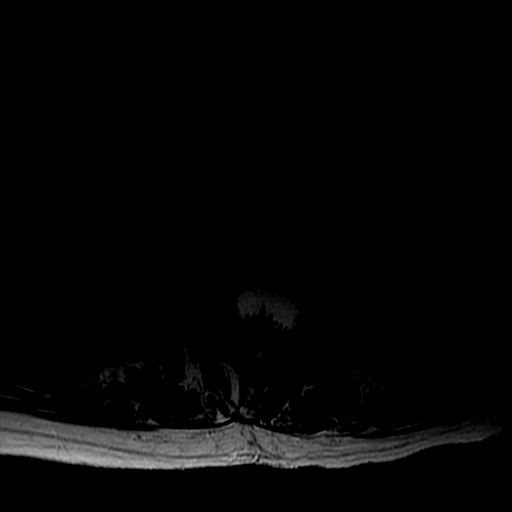

[19 of 48 positions shown; findings below may reference images not displayed]

FINDINGS: MRI THORACIC SPINE FINDINGS

Alignment:  Mild curvature convex to the right.

Vertebrae: Abnormal marrow signal within the thoracic region
consistent with the clinical history of myeloma. Marrow is in
general hypercellular. There are more prominent hypercellular a
changes with some partial compression fractures at T10, T11 and T12.
These levels showed fractures on the CT study of 3 weeks ago but did
not show fractures on MRI scan September 2015. No evidence of
extraosseous tumor or neural compression however.

Cord:  No cord compression or primary cord lesion.

Paraspinal and other soft tissues: Negative

Disc levels:

Minimal disc bulges. No disc herniation or compressive stenosis of
the canal or foramina

MRI LUMBAR SPINE FINDINGS

Segmentation:  5 lumbar type vertebral bodies.

Alignment:  Mild curvature.

Vertebrae: Compression deformities at each level in the lumbar
spine, not progressive since the previous study. Marrow signal in
the lumbar spine does not suggest definite myeloma involvement.
However, there is a large plasmacytoma that has developed in the
right iliac bone, up to 8 cm in diameter. Abnormal marrow signal
also present in the right femoral neck, which is presumed to relate
to neoplastic disease and could place the patient at increased risk
of right femur fracture. This was not primarily/completely
evaluated.

Conus medullaris: Extends to the L1-2 level and appears normal.

Paraspinal and other soft tissues: Right iliac bone plasmacytoma and
right femoral neck abnormality as noted above.

Disc levels:

L1-2: Disc bulge without visible neural compression.

L2-3:  Disc bulge.  Left lateral recess narrowing.

L3-4: Disc bulge. Bilateral lateral recess narrowing without visible
neural compression.

L4-5: Facet arthropathy with 3 mm of anterolisthesis. Disc bulge.
Narrowing of both lateral recesses. Foraminal narrowing on the left
that could affect the L4 nerve root.

L5-S1: No stenosis.
IMPRESSION: In the thoracic spine, the marrow space in general is hypercellular.
This is particularly evident at T10, T11 and T12 where I suspect
there is myeloma involvement of the marrow. There is no extraosseous
tumor or compromise of the neural structures however. Partial
compression fractures of T10, T11 and T12 occurred after the MRI of
September 2015 and before the CT scan of 05/14/2016.

In the lumbar region, there are old compression deformities
throughout which appear unchanged. There is definite myeloma
involvement of the right iliac bone with there is a large
plasmacytoma measuring up to 8 cm. There is also abnormal marrow
signal in the right femoral neck which is presumed to represent
myeloma involvement. This would place the patient at increased risk
of right femoral neck fracture.
